# Patient Record
Sex: Female | Born: 1955 | Race: White | Hispanic: No | Marital: Married | Smoking: Former smoker
Health system: Southern US, Community
[De-identification: ages and names within clinical notes are randomized; demographics above are authoritative.]

## PROBLEM LIST (undated history)

## (undated) DIAGNOSIS — D369 Benign neoplasm, unspecified site: Secondary | ICD-10-CM

## (undated) DIAGNOSIS — B019 Varicella without complication: Secondary | ICD-10-CM

## (undated) DIAGNOSIS — M1990 Unspecified osteoarthritis, unspecified site: Secondary | ICD-10-CM

## (undated) DIAGNOSIS — I10 Essential (primary) hypertension: Secondary | ICD-10-CM

## (undated) DIAGNOSIS — N390 Urinary tract infection, site not specified: Secondary | ICD-10-CM

## (undated) DIAGNOSIS — E785 Hyperlipidemia, unspecified: Secondary | ICD-10-CM

## (undated) HISTORY — DX: Varicella without complication: B01.9

## (undated) HISTORY — PX: COLONOSCOPY: SHX174

## (undated) HISTORY — DX: Hyperlipidemia, unspecified: E78.5

## (undated) HISTORY — DX: Urinary tract infection, site not specified: N39.0

---

## 2015-08-24 ENCOUNTER — Emergency Department (HOSPITAL_COMMUNITY)
Admission: EM | Admit: 2015-08-24 | Discharge: 2015-08-24 | Disposition: A | Discharge status: Home / Self Care | Payer: 59 | Attending: Physician Assistant | Admitting: Physician Assistant

## 2015-08-24 ENCOUNTER — Encounter (HOSPITAL_COMMUNITY): Payer: Self-pay | Admitting: *Deleted

## 2015-08-24 ENCOUNTER — Emergency Department (HOSPITAL_COMMUNITY): Payer: 59

## 2015-08-24 DIAGNOSIS — Z87891 Personal history of nicotine dependence: Secondary | ICD-10-CM | POA: Diagnosis not present

## 2015-08-24 DIAGNOSIS — M79602 Pain in left arm: Secondary | ICD-10-CM | POA: Insufficient documentation

## 2015-08-24 DIAGNOSIS — R200 Anesthesia of skin: Secondary | ICD-10-CM | POA: Diagnosis not present

## 2015-08-24 DIAGNOSIS — I10 Essential (primary) hypertension: Secondary | ICD-10-CM | POA: Insufficient documentation

## 2015-08-24 HISTORY — DX: Essential (primary) hypertension: I10

## 2015-08-24 LAB — CBC
HCT: 40.7 % (ref 36.0–46.0)
HEMOGLOBIN: 14.1 g/dL (ref 12.0–15.0)
MCH: 31.4 pg (ref 26.0–34.0)
MCHC: 34.6 g/dL (ref 30.0–36.0)
MCV: 90.6 fL (ref 78.0–100.0)
PLATELETS: 260 10*3/uL (ref 150–400)
RBC: 4.49 MIL/uL (ref 3.87–5.11)
RDW: 12.8 % (ref 11.5–15.5)
WBC: 6.2 10*3/uL (ref 4.0–10.5)

## 2015-08-24 LAB — I-STAT TROPONIN, ED: TROPONIN I, POC: 0 ng/mL (ref 0.00–0.08)

## 2015-08-24 LAB — BASIC METABOLIC PANEL
ANION GAP: 9 (ref 5–15)
BUN: 17 mg/dL (ref 6–20)
CALCIUM: 9.5 mg/dL (ref 8.9–10.3)
CO2: 22 mmol/L (ref 22–32)
CREATININE: 0.97 mg/dL (ref 0.44–1.00)
Chloride: 109 mmol/L (ref 101–111)
GLUCOSE: 122 mg/dL — AB (ref 65–99)
Potassium: 3.9 mmol/L (ref 3.5–5.1)
SODIUM: 140 mmol/L (ref 135–145)

## 2015-08-24 MED ORDER — SODIUM CHLORIDE 0.9 % IV BOLUS (SEPSIS): 1000.0000 mL | Freq: Once | INTRAVENOUS | Status: DC

## 2015-08-24 NOTE — ED Notes (Signed)
Pt reports hx of HTN, but stopped taking medications years ago. Reports left arm numbness and pain x3 weeks, pt could not sleep last night due to pain so came to ED today. Denies trauma or injury. Denies SOB. Pain at present 3/10.

## 2015-08-24 NOTE — Discharge Instructions (Signed)
You have a lung nodule you need a follow up CT. Your blood pressure is out of control and you need to follow up with a physician.   Paresthesia Paresthesia is a burning or prickling feeling. This feeling can happen in any part of the body. It often happens in the hands, arms, legs, or feet. Usually, it is not painful. In most cases, the feeling goes away in a short time and is not a sign of a serious problem. HOME CARE  Avoid drinking alcohol.  Try massage or needle therapy (acupuncture) to help with your problems.  Keep all follow-up visits as told by your doctor. This is important. GET HELP IF:  You keep on having episodes of paresthesia.  Your burning or prickling feeling gets worse when you walk.  You have pain or cramps.  You feel dizzy.  You have a rash. GET HELP RIGHT AWAY IF:  You feel weak.  You have trouble walking or moving.  You have problems speaking, understanding, or seeing.  You feel confused.  You cannot control when you pee (urinate) or poop (bowel movement).  You lose feeling (numbness) after an injury.  You pass out (faint).   This information is not intended to replace advice given to you by your health care provider. Make sure you discuss any questions you have with your health care provider.   Document Released: 10/06/2008 Document Revised: 03/10/2015 Document Reviewed: 10/20/2014 Elsevier Interactive Patient Education 2016 [employer].    Emergency Department Resource Guide 1) Find a Doctor and Pay Out of Pocket Although you won't have to find out who is covered by your insurance plan, it is a good idea to ask around and get recommendations. You will then need to call the office and see if the doctor you have chosen will accept you as a new patient and what types of options they offer for patients who are self-pay. Some doctors offer discounts or will set up payment plans for their patients who do not have insurance, but you will need to ask so  you aren't surprised when you get to your appointment.  2) Contact Your Local Health Department Not all health departments have doctors that can see patients for sick visits, but many do, so it is worth a call to see if yours does. If you don't know where your local health department is, you can check in your phone book. The CDC also has a tool to help you locate your state's health department, and many state websites also have listings of all of their local health departments.  3) Find a [facility] If your illness is not likely to be very severe or complicated, you may want to try a walk in clinic. These are popping up all over the country in pharmacies, drugstores, and shopping centers. They're usually staffed by nurse practitioners or physician assistants that have been trained to treat common illnesses and complaints. They're usually fairly quick and inexpensive. However, if you have serious medical issues or chronic medical problems, these are probably not your best option.  No Primary Care Doctor: - Call Health Connect at  [phone] - they can help you locate a primary care doctor that  accepts your insurance, provides certain services, etc. - Physician Referral Service- [phone]  Chronic Pain Problems: Organization         Address  Phone   Notes  [facility]  [phone] Patients need to be referred by their primary care doctor.  Medication Assistance: Organization         Address  Phone   Notes  [facility] Medication [facility] [address] [place], [place] [address] [phone] --Must be a resident of [facility] -- Must have NO insurance coverage whatsoever (no Medicaid/ Medicare, etc.) -- The pt. MUST have a primary care doctor that directs their care regularly and follows them in the community   MedAssist  [phone]   [employer]  [phone]    Agencies that provide inexpensive medical  care: Organization         Address  Phone   Notes  [place]  [phone]   [facility]    [phone]   [facility] [place], [place] [address] [phone]   [place] [address] [place] [phone]   Planned Parenthood    [phone]   [facility]    [phone]   [place] and [place] [address], [place] Phone:  [phone], Fax:  [phone] Hours of Operation:  9 am - 6 pm, M-F.  Also accepts Medicaid/Medicare and self-pay.  [facility] for [address], [address], [place] Phone: [phone], Fax: [phone]. Hours of Operation:  8:30 am - 5:30 pm, M-F.  Also accepts Medicaid and self-pay.  [facility] [place] [address], [place] Phone: [phone]   [place], [place], [place] [phone], Ext. [phone] Mondays & Thursdays: 7-9 AM.  First 15 patients are seen on a first come, first serve basis.    [place] Providers:  Organization         Address  Phone   Notes  [facility] [address], [place] [phone] Also accepts self-pay patients.  [facility] [address], [place]  [phone]   [place], [address], [place] [phone]   [facility] [address], [place] [phone]   Lucianne Lei [address], [place]   [phone] Only accepts [place] Access [place] patients after they have their name applied to their card.   Self-Pay (no insurance) in [facility]:  Organization         Address  Phone   Notes  Sickle Cell Patients, [facility] [place] [phone]   [facility] [place] [phone]   [facility] [place]  [place], [place], [employer] [place] [phone]   [facility]/Dr. Osei-Bonsu  [address] [place] or [address], [place] [phone] Phone number for both [place] and [place] locations is the same.  [facility] [address] [place] [phone]   [facility] [address] [place] or [address] [phone]   [facility] [address], [place] [phone], phone; [phone], fax Sees patients 1st and 3rd Saturday of every month.  Must not qualify for public or private insurance (i.e. Medicaid, Medicare, [place] Health Choice, Veterans' Benefits)  Household income should be no more than 200% of the poverty level The  clinic cannot treat you if you are pregnant or think you are pregnant  Sexually transmitted diseases are not treated at the clinic.    Dental Care: Organization         Address  Phone  Notes  [facility] Department of [facility] [place] [phone] Accepts children up to age 46 who are enrolled in [place] or [place]; pregnant women with a Medicaid card; and children who have applied for Medicaid or [place] Health Choice, but were declined, whose parents can pay a reduced fee at time of service.  [facility] Department of [facility]  [address], [place] [phone] Accepts children up to age 21 who are enrolled in [place] or [place]; pregnant women with a Medicaid card; and children who have applied for Medicaid or [place] Health Choice, but were declined, whose parents can pay a reduced fee at time of service.  [place] Adult [facility] Access PRO[facility]  [place] [phone] Patients are seen by appointment only. Walk-ins are not accepted. [place] will see patients 8 years of age and older. Monday - Tuesday (8am-5pm) Most Wednesdays (8:30-5pm) $30 per visit, cash only  [facility] Adult Dental Access PROGRAM  [address], [facility] [phone] Patients are seen by appointment only. Walk-ins are not accepted. [place] will see patients 37 years of age and older. One Wednesday Evening (Monthly: Volunteer Based).  $30 per visit, cash only  [place]  [phone] for adults; Children under age 8, call Graduate Pediatric Dentistry at [phone]. Children aged 74-14, please call [phone] to request a pediatric application.  Dental services are provided in all areas of dental care including fillings, crowns and bridges, complete and partial dentures, implants, gum treatment, root canals, and extractions. Preventive care is also provided. Treatment is provided to both adults and children. Patients are selected via a lottery and there is often a waiting list.   [facility] [address] [place]  [phone] [email or web address]   [facility] [address] [place], [place] [phone], Ext. [phone] Second and Fourth Thursday of each month, opens at 6:30 AM; Clinic ends at 9 AM.  Patients are seen on a first-come first-served basis, and a limited number are seen during each clinic.   [facility]  [address] Hillard Danker [place], [place] [phone]   Eligibility Requirements You must have lived in [place], [place], or [place] counties for at least the last three months.   You cannot be eligible for state or federal sponsored [employer], including [employer], [place], or [employer].   You generally cannot be eligible for healthcare insurance through your employer.    How to apply: Eligibility screenings are held every Tuesday and Wednesday afternoon from 1:00 pm until 4:00 pm. You do not need an appointment for the interview!   [facility] [address] [place], [place]   [place]  [place] Department  [place]  [phone]    Behavioral Health Resources in the Community: Intensive Outpatient Programs Organization         Address  Phone  Notes  [place]. [address] [place], [place] [phone]   [facility] [address] [place],  [place] [phone]   ADS: [facility] & Drug S[facility] [address] [place], [place]   [place] [address],  [place], [place] or [phone]   Substance Abuse Resources Organization         Address  Phone  Notes  Alcohol and Drug Services  6311356418   [place]  [phone]   The [place]   Chinita Pester  [phone]   Residential & Outpatient Substance Abuse Program  [phone]   Psychological Services Organization         Address  Phone  Notes  [facility] [place]  [place]  [phone]   [place] [address], [place] or [phone]    Mobile Crisis Teams Organization         Address  Phone  Notes  [facility], Mobile Crisis Care Unit  [phone]   [facility]  [address] [place], [place]   [place] [address] [place] [phone]    Self-Help/Support Groups Organization         Address  Phone             Notes  [place]. of [place] - variety of support groups  [place] Call for more information  Narcotics Anonymous (NA), [facility] [address], [employer] [place]  2 meetings at this location   Special educational needs teacher         Address  Phone  Notes  [facility] [place],    [place]  [phone]   [facility]  [address], [place] [address], [place], [place]   [place], [place] [phone] Admissions: 8am-3pm M-F  Incentives Substance [place] [address],    [place], [place] [phone]   [facility] [address] [place], [place] [employer] [place], [place]   [facility] [address],  [place], [place]   [facility] - Intensive Outpatient [address], [place], [place]   [facility] [place].,  [place], [place] [phone] or [phone]   [facility] ([facility]) [address] [place], [place] Accepts Medicaid  [facility] [place] [address]  [place] [phone] Substance Abuse/Addiction Treatment   [facility] Organization         Address  Phone  Notes  [facility]  [phone]   Domenic Schwab, PhD [address] Arlis [place], [place]   [phone] or [phone]   [place], [place] [phone]   [facility] [address] [place], [place] [phone] Insurance/Medicaid/sponsorship through [facility] and Families [address], [address] [place]                                    [place], [place] [phone] [place] [address], [place] [phone]    Dr. Adele Schilder  [phone]   [facility] Dept. 1) [address], [place] 2) [place] 3)  [place] [address], [place] [phone]  (  [place] [phone] or [phone] (After Hours)

## 2015-08-24 NOTE — ED Provider Notes (Signed)
CSN: [id number]     Arrival date & time 08/24/15  0848 History   First MD Initiated Contact with Patient 08/24/15 [phone]     Chief Complaint  Patient presents with  . left arm numbness and pain      (Consider location/radiation/quality/duration/timing/severity/associated sxs/prior Treatment) HPI  Patient is very pleasant 59 year old female presenting with left arm numbness and occasional pain for the last 3 weeks on and off. Patient has insurance has insurance but has not seen a primary care physician bc she says because she doesn't "want to be stuck in that loop of doctors"  Patient has had on and off left whole arm numbness and pain. She is not having any numbness or pain currently. Patient's female partner states that she often sleeps with her head hanging off the side of the bed. Patient had no weight loss, weakness.   Past Medical History  Diagnosis Date  . Hypertension    History reviewed. No pertinent past surgical history. History reviewed. No pertinent family history. Social History  Substance Use Topics  . Smoking status: Former Smoker -- 1.00 packs/day for 40 years    Types: Cigarettes  . Smokeless tobacco: None  . Alcohol Use: Yes     Comment: daily   OB History    No data available     Review of Systems  Constitutional: Negative for fever, activity change and fatigue.  HENT: Negative for congestion.   Eyes: Negative for discharge.  Respiratory: Negative for cough, chest tightness and shortness of breath.   Cardiovascular: Negative for chest pain, palpitations and leg swelling.  Gastrointestinal: Negative for abdominal distention.  Genitourinary: Negative for dysuria and difficulty urinating.  Musculoskeletal: Negative for joint swelling.  Skin: Negative for rash.  Allergic/Immunologic: Negative for immunocompromised state.  Neurological: Positive for numbness. Negative for dizziness, seizures, speech difficulty, weakness, light-headedness and headaches.   Psychiatric/Behavioral: Negative for behavioral problems and confusion.      Allergies  Review of patient's allergies indicates no known allergies.  Home Medications   Prior to Admission medications   Medication Sig Start Date End Date Taking? Authorizing Provider  acetaminophen (TYLENOL) 500 MG tablet Take 500 mg by mouth every 4 (four) hours as needed for mild pain, moderate pain, fever or headache.   Yes Historical Provider, MD  ibuprofen (ADVIL,MOTRIN) 200 MG tablet Take 200 mg by mouth every 4 (four) hours as needed for fever, headache, mild pain, moderate pain or cramping.   Yes Historical Provider, MD   BP 185/108 mmHg  Pulse 122  Temp(Src) 97.9 F (36.6 C) (Oral)  Resp 16  SpO2 98% Physical Exam  Constitutional: She is oriented to person, place, and time. She appears well-developed and well-nourished.  HENT:  Head: Normocephalic and atraumatic.  Eyes: Conjunctivae are normal. Right eye exhibits no discharge.  Neck: Neck supple.  Cardiovascular: Normal rate, regular rhythm and normal heart sounds.   No murmur heard. Pulmonary/Chest: Effort normal and breath sounds normal. She has no wheezes. She has no rales.  Abdominal: Soft. She exhibits no distension. There is no tenderness.  Musculoskeletal: Normal range of motion. She exhibits no edema.  Normal strength and sensation in bilateral upper extremities.  Neurological: She is oriented to person, place, and time. No cranial nerve deficit.  Skin: Skin is warm and dry. No rash noted. She is not diaphoretic.  Psychiatric: She has a normal mood and affect. Her behavior is normal.  Nursing note and vitals reviewed.   ED Course  Procedures (including critical  care time) Labs Review Labs Reviewed  BASIC METABOLIC PANEL - Abnormal; Notable for the following:    Glucose, Bld 122 (*)    All other components within normal limits  CBC  I-STAT TROPOININ, ED    Imaging Review Dg Chest 2 View  08/24/2015  CLINICAL DATA:   Left upper extremity pain and numbness for 3 weeks. EXAM: CHEST  2 VIEW COMPARISON:  None. FINDINGS: Normal heart size. Normal mediastinal contour. No pneumothorax. No pleural effusion. There is a 4 mm nodular density in the peripheral upper right lung overlying the anterior right third rib. Otherwise clear lungs, with no pulmonary edema and no focal lung consolidation. IMPRESSION: Tiny 4 mm nodular density in the peripheral upper right lung overlying the anterior right third rib, differential includes pulmonary nodule or sclerotic anterior right third rib lesion. Otherwise no active disease in the chest. If no prior chest imaging is available to confirm stability of this finding, recommend follow-up short-term nonemergent unenhanced chest CT for further evaluation. Electronically Signed   By: Ilona Sorrel M.D.   On: 08/24/2015 09:39   I have personally reviewed and evaluated these images and lab results as part of my medical decision-making.   EKG Interpretation   Date/Time:  Monday August 24 2015 08:59:46 EDT Ventricular Rate:  111 PR Interval:  158 QRS Duration: 92 QT Interval:  325 QTC Calculation: 442 R Axis:   90 Text Interpretation:  Sinus tachycardia Borderline right axis deviation no  acute ischemia normal sinus rhtym.  Confirmed by Gerald Leitz ([id number])  on 08/24/2015 9:08:14 AM      MDM   Final diagnoses:  None    Patient is a very pleasant 59 year old female presenting with left arm on and off tingling and pain for the last 3 weeks. Patient denies a chest pain. Patient does have hypertension but has not been going to physicians and therefore has not been taking any medications for it.   We will get chest x-ray, EKG, and a single troponin which will be sufficient given the length of symptoms.  She reports that the numbness and pain is worse when she lies down at night.  Patient does not like physicians. However we talked to her and her husband about the necessity of  following up on her high blood pressure, and a pulmonary nodule seen on her chest x-ray. Patient also may require outpatient imaging of her  C-spine given her symptoms. However do not think it warrants emergent imaging given the lack of weakness or red flags-no fevers no bowel or bladder no weakness.    Dyneshia Baccam Julio Alm, MD 08/24/15 1011

## 2015-08-31 ENCOUNTER — Encounter: Payer: Self-pay | Admitting: Family

## 2015-08-31 ENCOUNTER — Ambulatory Visit (INDEPENDENT_AMBULATORY_CARE_PROVIDER_SITE_OTHER): Payer: 59 | Admitting: Family

## 2015-08-31 ENCOUNTER — Ambulatory Visit (INDEPENDENT_AMBULATORY_CARE_PROVIDER_SITE_OTHER)
Admission: RE | Admit: 2015-08-31 | Discharge: 2015-08-31 | Disposition: A | Discharge status: Home / Self Care | Payer: 59 | Source: Ambulatory Visit | Attending: Family | Admitting: Family

## 2015-08-31 VITALS — BP 160/102 | HR 112 | Temp 98.2°F | Resp 16 | Ht 66.0 in | Wt 160.0 lb

## 2015-08-31 DIAGNOSIS — R911 Solitary pulmonary nodule: Secondary | ICD-10-CM | POA: Diagnosis not present

## 2015-08-31 DIAGNOSIS — M79602 Pain in left arm: Secondary | ICD-10-CM | POA: Diagnosis not present

## 2015-08-31 MED ORDER — IBUPROFEN 800 MG PO TABS: 800.0000 mg | ORAL_TABLET | Freq: Three times a day (TID) | ORAL | Status: DC | PRN

## 2015-08-31 NOTE — Assessment & Plan Note (Signed)
Incidentally noted lung nodule located in the right lung. Asymptomatic at present. Recommended follow up with CT scan. Obtain CT chest to follow up on lung nodule.

## 2015-08-31 NOTE — Progress Notes (Signed)
Pre visit review using our clinic review tool, if applicable. No additional management support is needed unless otherwise documented below in the visit note.   Refused flu and tetanus at this time

## 2015-08-31 NOTE — Assessment & Plan Note (Signed)
Left arm pain of questionable origin possibly cervical. Obtain x-rays to rule out cervical pathology. Muscle spasm of upper trapezius noted. Treat conservatively at this time with ibuprofen, home exercise therapy, and heat/ice. Follow-up pending home exercise therapy and imaging.

## 2015-08-31 NOTE — Patient Instructions (Addendum)
Thank you for choosing [facility].  Summary/Instructions:  Your prescription(s) have been submitted to your pharmacy or been printed and provided for you. Please take as directed and contact our office if you believe you are having problem(s) with the medication(s) or have any questions.  Please stop by radiology on the basement level of the building for your x-rays. Your results will be released to Page (or called to you) after review, usually within 72 hours after test completion. If any treatments or changes are necessary, you will be notified at that same time.  Referrals have been made during this visit. You should expect to hear back from our schedulers in about 7-10 days in regards to establishing an appointment with the specialists we discussed.   If your symptoms worsen or fail to improve, please contact our office for further instruction, or in case of emergency go directly to the emergency room at the closest medical facility.   TREATMENT  Treatment initially involves the use of ice and medication to help reduce pain and inflammation. It is also important to perform strengthening and stretching exercises and modify activities that worsen symptoms so the injury does not get worse. These exercises may be performed at home or with a therapist. For patients who experience severe symptoms, a soft, padded collar may be recommended to be worn around the neck.  Improving your posture may help reduce symptoms. Posture improvement includes pulling your chin and abdomen in while sitting or standing. If you are sitting, sit in a firm chair with your buttocks against the back of the chair. While sleeping, try replacing your pillow with a small towel rolled to 2 inches in diameter, or use a cervical pillow or soft cervical collar. Poor sleeping positions delay healing.  For patients with nerve root damage, which causes numbness or weakness, the use of a cervical traction apparatus may be  recommended. Surgery is rarely necessary for these injuries. However, cervical strain and sprains that are present at birth (congenital) may require surgery. MEDICATION   If pain medication is necessary, nonsteroidal anti-inflammatory medications, such as aspirin and ibuprofen, or other minor pain relievers, such as acetaminophen, are often recommended.  Do not take pain medication for 7 days before surgery.  Prescription pain relievers may be given if deemed necessary by your caregiver. Use only as directed and only as much as you need. HEAT AND COLD:   Cold treatment (icing) relieves pain and reduces inflammation. Cold treatment should be applied for 10 to 15 minutes every 2 to 3 hours for inflammation and pain and immediately after any activity that aggravates your symptoms. Use ice packs or an ice massage.  Heat treatment may be used prior to performing the stretching and strengthening activities prescribed by your caregiver, physical therapist, or athletic trainer. Use a heat pack or a warm soak. SEEK MEDICAL CARE IF:   Symptoms get worse or do not improve in 2 weeks despite treatment.  New, unexplained symptoms develop (drugs used in treatment may produce side effects). EXERCISES RANGE OF MOTION (ROM) AND STRETCHING EXERCISES - Cervical Strain and Sprain These exercises may help you when beginning to rehabilitate your injury. In order to successfully resolve your symptoms, you must improve your posture. These exercises are designed to help reduce the forward-head and rounded-shoulder posture which contributes to this condition. Your symptoms may resolve with or without further involvement from your physician, physical therapist or athletic trainer. While completing these exercises, remember:   Restoring tissue flexibility helps normal  motion to return to the joints. This allows healthier, less painful movement and activity.  An effective stretch should be held for at least 20 seconds,  although you may need to begin with shorter hold times for comfort.  A stretch should never be painful. You should only feel a gentle lengthening or release in the stretched tissue. STRETCH- Axial Extensors  Lie on your back on the floor. You may bend your knees for comfort. Place a rolled-up hand towel or dish towel, about 2 inches in diameter, under the part of your head that makes contact with the floor.  Gently tuck your chin, as if trying to make a "double chin," until you feel a gentle stretch at the base of your head.  Hold __________ seconds. Repeat __________ times. Complete this exercise __________ times per day.  STRETCH - Axial Extension   Stand or sit on a firm surface. Assume a good posture: chest up, shoulders drawn back, abdominal muscles slightly tense, knees unlocked (if standing) and feet hip width apart.  Slowly retract your chin so your head slides back and your chin slightly lowers. Continue to look straight ahead.  You should feel a gentle stretch in the back of your head. Be certain not to feel an aggressive stretch since this can cause headaches later.  Hold for __________ seconds. Repeat __________ times. Complete this exercise __________ times per day. STRETCH - Cervical Side Bend   Stand or sit on a firm surface. Assume a good posture: chest up, shoulders drawn back, abdominal muscles slightly tense, knees unlocked (if standing) and feet hip width apart.  Without letting your nose or shoulders move, slowly tip your right / left ear to your shoulder until your feel a gentle stretch in the muscles on the opposite side of your neck.  Hold __________ seconds. Repeat __________ times. Complete this exercise __________ times per day. STRETCH - Cervical Rotators   Stand or sit on a firm surface. Assume a good posture: chest up, shoulders drawn back, abdominal muscles slightly tense, knees unlocked (if standing) and feet hip width apart.  Keeping your eyes level  with the ground, slowly turn your head until you feel a gentle stretch along the back and opposite side of your neck.  Hold __________ seconds. Repeat __________ times. Complete this exercise __________ times per day. RANGE OF MOTION - Neck Circles   Stand or sit on a firm surface. Assume a good posture: chest up, shoulders drawn back, abdominal muscles slightly tense, knees unlocked (if standing) and feet hip width apart.  Gently roll your head down and around from the back of one shoulder to the back of the other. The motion should never be forced or painful.  Repeat the motion 10-20 times, or until you feel the neck muscles relax and loosen. Repeat __________ times. Complete the exercise __________ times per day. STRENGTHENING EXERCISES - Cervical Strain and Sprain These exercises may help you when beginning to rehabilitate your injury. They may resolve your symptoms with or without further involvement from your physician, physical therapist, or athletic trainer. While completing these exercises, remember:   Muscles can gain both the endurance and the strength needed for everyday activities through controlled exercises.  Complete these exercises as instructed by your physician, physical therapist, or athletic trainer. Progress the resistance and repetitions only as guided.  You may experience muscle soreness or fatigue, but the pain or discomfort you are trying to eliminate should never worsen during these exercises. If this pain does  worsen, stop and make certain you are following the directions exactly. If the pain is still present after adjustments, discontinue the exercise until you can discuss the trouble with your clinician. STRENGTH - Cervical Flexors, Isometric  Face a wall, standing about 6 inches away. Place a small pillow, a ball about 6-8 inches in diameter, or a folded towel between your forehead and the wall.  Slightly tuck your chin and gently push your forehead into the  soft object. Push only with mild to moderate intensity, building up tension gradually. Keep your jaw and forehead relaxed.  Hold 10 to 20 seconds. Keep your breathing relaxed.  Release the tension slowly. Relax your neck muscles completely before you start the next repetition. Repeat __________ times. Complete this exercise __________ times per day. STRENGTH- Cervical Lateral Flexors, Isometric   Stand about 6 inches away from a wall. Place a small pillow, a ball about 6-8 inches in diameter, or a folded towel between the side of your head and the wall.  Slightly tuck your chin and gently tilt your head into the soft object. Push only with mild to moderate intensity, building up tension gradually. Keep your jaw and forehead relaxed.  Hold 10 to 20 seconds. Keep your breathing relaxed.  Release the tension slowly. Relax your neck muscles completely before you start the next repetition. Repeat __________ times. Complete this exercise __________ times per day. STRENGTH - Cervical Extensors, Isometric   Stand about 6 inches away from a wall. Place a small pillow, a ball about 6-8 inches in diameter, or a folded towel between the back of your head and the wall.  Slightly tuck your chin and gently tilt your head back into the soft object. Push only with mild to moderate intensity, building up tension gradually. Keep your jaw and forehead relaxed.  Hold 10 to 20 seconds. Keep your breathing relaxed.  Release the tension slowly. Relax your neck muscles completely before you start the next repetition. Repeat __________ times. Complete this exercise __________ times per day. POSTURE AND BODY MECHANICS CONSIDERATIONS - Cervical Strain and Sprain Keeping correct posture when sitting, standing or completing your activities will reduce the stress put on different body tissues, allowing injured tissues a chance to heal and limiting painful experiences. The following are general guidelines for improved  posture. Your physician or physical therapist will provide you with any instructions specific to your needs. While reading these guidelines, remember:  The exercises prescribed by your provider will help you have the flexibility and strength to maintain correct postures.  The correct posture provides the optimal environment for your joints to work. All of your joints have less wear and tear when properly supported by a spine with good posture. This means you will experience a healthier, less painful body.  Correct posture must be practiced with all of your activities, especially prolonged sitting and standing. Correct posture is as important when doing repetitive low-stress activities (typing) as it is when doing a single heavy-load activity (lifting). PROLONGED STANDING WHILE SLIGHTLY LEANING FORWARD When completing a task that requires you to lean forward while standing in one place for a long time, place either foot up on a stationary 2- to 4-inch high object to help maintain the best posture. When both feet are on the ground, the low back tends to lose its slight inward curve. If this curve flattens (or becomes too large), then the back and your other joints will experience too much stress, fatigue more quickly, and can cause pain.  RESTING POSITIONS Consider which positions are most painful for you when choosing a resting position. If you have pain with flexion-based activities (sitting, bending, stooping, squatting), choose a position that allows you to rest in a less flexed posture. You would want to avoid curling into a fetal position on your side. If your pain worsens with extension-based activities (prolonged standing, working overhead), avoid resting in an extended position such as sleeping on your stomach. Most people will find more comfort when they rest with their spine in a more neutral position, neither too rounded nor too arched. Lying on a non-sagging bed on your side with a pillow  between your knees, or on your back with a pillow under your knees will often provide some relief. Keep in mind, being in any one position for a prolonged period of time, no matter how correct your posture, can still lead to stiffness. WALKING Walk with an upright posture. Your ears, shoulders, and hips should all line up. OFFICE WORK When working at a desk, create an environment that supports good, upright posture. Without extra support, muscles fatigue and lead to excessive strain on joints and other tissues. CHAIR:  A chair should be able to slide under your desk when your back makes contact with the back of the chair. This allows you to work closely.  The chair's height should allow your eyes to be level with the upper part of your monitor and your hands to be slightly lower than your elbows.  Body position:  Your feet should make contact with the floor. If this is not possible, use a foot rest.  Keep your ears over your shoulders. This will reduce stress on your neck and low back.   This information is not intended to replace advice given to you by your health care provider. Make sure you discuss any questions you have with your health care provider.   Document Released: 10/24/2005 Document Revised: 11/14/2014 Document Reviewed: 02/05/2009 [employer] Interactive Patient Education 2016 [employer]  Impingement Syndrome, Rotator Cuff, Bursitis With Rehab Impingement syndrome is a condition that involves inflammation of the tendons of the rotator cuff and the subacromial bursa, that causes pain in the shoulder. The rotator cuff consists of four tendons and muscles that control much of the shoulder and upper arm function. The subacromial bursa is a fluid filled sac that helps reduce friction between the rotator cuff and one of the bones of the shoulder (acromion). Impingement syndrome is usually an overuse injury that causes swelling of the bursa (bursitis), swelling of the tendon  (tendonitis), and/or a tear of the tendon (strain). Strains are classified into three categories. Grade 1 strains cause pain, but the tendon is not lengthened. Grade 2 strains include a lengthened ligament, due to the ligament being stretched or partially ruptured. With grade 2 strains there is still function, although the function may be decreased. Grade 3 strains include a complete tear of the tendon or muscle, and function is usually impaired. SYMPTOMS   Pain around the shoulder, often at the outer portion of the upper arm.  Pain that gets worse with shoulder function, especially when reaching overhead or lifting.  Sometimes, aching when not using the arm.  Pain that wakes you up at night.  Sometimes, tenderness, swelling, warmth, or redness over the affected area.  Loss of strength.  Limited motion of the shoulder, especially reaching behind the back (to the back pocket or to unhook bra) or across your body.  Crackling sound (crepitation) when moving  the arm.  Biceps tendon pain and inflammation (in the front of the shoulder). Worse when bending the elbow or lifting. CAUSES  Impingement syndrome is often an overuse injury, in which chronic (repetitive) motions cause the tendons or bursa to become inflamed. A strain occurs when a force is paced on the tendon or muscle that is greater than it can withstand. Common mechanisms of injury include: Stress from sudden increase in duration, frequency, or intensity of training.  Direct hit (trauma) to the shoulder.  Aging, erosion of the tendon with normal use.  Bony bump on shoulder (acromial spur). RISK INCREASES WITH:  Contact sports (football, wrestling, boxing).  Throwing sports (baseball, tennis, volleyball).  Weightlifting and bodybuilding.  Heavy labor.  Previous injury to the rotator cuff, including impingement.  Poor shoulder strength and flexibility.  Failure to warm up properly before activity.  Inadequate  protective equipment.  Old age.  Bony bump on shoulder (acromial spur). PREVENTION   Warm up and stretch properly before activity.  Allow for adequate recovery between workouts.  Maintain physical fitness:  Strength, flexibility, and endurance.  Cardiovascular fitness.  Learn and use proper exercise technique. PROGNOSIS  If treated properly, impingement syndrome usually goes away within 6 weeks. Sometimes surgery is required.  RELATED COMPLICATIONS   Longer healing time if not properly treated, or if not given enough time to heal.  Recurring symptoms, that result in a chronic condition.  Shoulder stiffness, frozen shoulder, or loss of motion.  Rotator cuff tendon tear.  Recurring symptoms, especially if activity is resumed too soon, with overuse, with a direct blow, or when using poor technique. TREATMENT  Treatment first involves the use of ice and medicine, to reduce pain and inflammation. The use of strengthening and stretching exercises may help reduce pain with activity. These exercises may be performed at home or with a therapist. If non-surgical treatment is unsuccessful after more than 6 months, surgery may be advised. After surgery and rehabilitation, activity is usually possible in 3 months.  MEDICATION  If pain medicine is needed, nonsteroidal anti-inflammatory medicines (aspirin and ibuprofen), or other minor pain relievers (acetaminophen), are often advised.  Do not take pain medicine for 7 days before surgery.  Prescription pain relievers may be given, if your caregiver thinks they are needed. Use only as directed and only as much as you need.  Corticosteroid injections may be given by your caregiver. These injections should be reserved for the most serious cases, because they may only be given a certain number of times. HEAT AND COLD  Cold treatment (icing) should be applied for 10 to 15 minutes every 2 to 3 hours for inflammation and pain, and immediately  after activity that aggravates your symptoms. Use ice packs or an ice massage.  Heat treatment may be used before performing stretching and strengthening activities prescribed by your caregiver, physical therapist, or athletic trainer. Use a heat pack or a warm water soak. SEEK MEDICAL CARE IF:   Symptoms get worse or do not improve in 4 to 6 weeks, despite treatment.  New, unexplained symptoms develop. (Drugs used in treatment may produce side effects.) EXERCISES  RANGE OF MOTION (ROM) AND STRETCHING EXERCISES - Impingement Syndrome (Rotator Cuff  Tendinitis, Bursitis) These exercises may help you when beginning to rehabilitate your injury. Your symptoms may go away with or without further involvement from your physician, physical therapist or athletic trainer. While completing these exercises, remember:   Restoring tissue flexibility helps normal motion to return to the joints.  This allows healthier, less painful movement and activity.  An effective stretch should be held for at least 30 seconds.  A stretch should never be painful. You should only feel a gentle lengthening or release in the stretched tissue. STRETCH - Flexion, Standing  Stand with good posture. With an underhand grip on your right / left hand, and an overhand grip on the opposite hand, grasp a broomstick or cane so that your hands are a little more than shoulder width apart.  Keeping your right / left elbow straight and shoulder muscles relaxed, push the stick with your opposite hand, to raise your right / left arm in front of your body and then overhead. Raise your arm until you feel a stretch in your right / left shoulder, but before you have increased shoulder pain.  Try to avoid shrugging your right / left shoulder as your arm rises, by keeping your shoulder blade tucked down and toward your mid-back spine. Hold for __________ seconds.  Slowly return to the starting position. Repeat __________ times. Complete this  exercise __________ times per day. STRETCH - Abduction, Supine  Lie on your back. With an underhand grip on your right / left hand and an overhand grip on the opposite hand, grasp a broomstick or cane so that your hands are a little more than shoulder width apart.  Keeping your right / left elbow straight and your shoulder muscles relaxed, push the stick with your opposite hand, to raise your right / left arm out to the side of your body and then overhead. Raise your arm until you feel a stretch in your right / left shoulder, but before you have increased shoulder pain.  Try to avoid shrugging your right / left shoulder as your arm rises, by keeping your shoulder blade tucked down and toward your mid-back spine. Hold for __________ seconds.  Slowly return to the starting position. Repeat __________ times. Complete this exercise __________ times per day. ROM - Flexion, Active-Assisted  Lie on your back. You may bend your knees for comfort.  Grasp a broomstick or cane so your hands are about shoulder width apart. Your right / left hand should grip the end of the stick, so that your hand is positioned "thumbs-up," as if you were about to shake hands.  Using your healthy arm to lead, raise your right / left arm overhead, until you feel a gentle stretch in your shoulder. Hold for __________ seconds.  Use the stick to assist in returning your right / left arm to its starting position. Repeat __________ times. Complete this exercise __________ times per day.  ROM - Internal Rotation, Supine   Lie on your back on a firm surface. Place your right / left elbow about 60 degrees away from your side. Elevate your elbow with a folded towel, so that the elbow and shoulder are the same height.  Using a broomstick or cane and your strong arm, pull your right / left hand toward your body until you feel a gentle stretch, but no increase in your shoulder pain. Keep your shoulder and elbow in place throughout  the exercise.  Hold for __________ seconds. Slowly return to the starting position. Repeat __________ times. Complete this exercise __________ times per day. STRETCH - Internal Rotation  Place your right / left hand behind your back, palm up.  Throw a towel or belt over your opposite shoulder. Grasp the towel with your right / left hand.  While keeping an upright posture, gently pull  up on the towel, until you feel a stretch in the front of your right / left shoulder.  Avoid shrugging your right / left shoulder as your arm rises, by keeping your shoulder blade tucked down and toward your mid-back spine.  Hold for __________ seconds. Release the stretch, by lowering your healthy hand. Repeat __________ times. Complete this exercise __________ times per day. ROM - Internal Rotation   Using an underhand grip, grasp a stick behind your back with both hands.  While standing upright with good posture, slide the stick up your back until you feel a mild stretch in the front of your shoulder.  Hold for __________ seconds. Slowly return to your starting position. Repeat __________ times. Complete this exercise __________ times per day.  STRETCH - Posterior Shoulder Capsule   Stand or sit with good posture. Grasp your right / left elbow and draw it across your chest, keeping it at the same height as your shoulder.  Pull your elbow, so your upper arm comes in closer to your chest. Pull until you feel a gentle stretch in the back of your shoulder.  Hold for __________ seconds. Repeat __________ times. Complete this exercise __________ times per day. STRENGTHENING EXERCISES - Impingement Syndrome (Rotator Cuff Tendinitis, Bursitis) These exercises may help you when beginning to rehabilitate your injury. They may resolve your symptoms with or without further involvement from your physician, physical therapist or athletic trainer. While completing these exercises, remember:  Muscles can gain both  the endurance and the strength needed for everyday activities through controlled exercises.  Complete these exercises as instructed by your physician, physical therapist or athletic trainer. Increase the resistance and repetitions only as guided.  You may experience muscle soreness or fatigue, but the pain or discomfort you are trying to eliminate should never worsen during these exercises. If this pain does get worse, stop and make sure you are following the directions exactly. If the pain is still present after adjustments, discontinue the exercise until you can discuss the trouble with your clinician.  During your recovery, avoid activity or exercises which involve actions that place your injured hand or elbow above your head or behind your back or head. These positions stress the tissues which you are trying to heal. STRENGTH - Scapular Depression and Adduction   With good posture, sit on a firm chair. Support your arms in front of you, with pillows, arm rests, or on a table top. Have your elbows in line with the sides of your body.  Gently draw your shoulder blades down and toward your mid-back spine. Gradually increase the tension, without tensing the muscles along the top of your shoulders and the back of your neck.  Hold for __________ seconds. Slowly release the tension and relax your muscles completely before starting the next repetition.  After you have practiced this exercise, remove the arm support and complete the exercise in standing as well as sitting position. Repeat __________ times. Complete this exercise __________ times per day.  STRENGTH - Shoulder Abductors, Isometric  With good posture, stand or sit about 4-6 inches from a wall, with your right / left side facing the wall.  Bend your right / left elbow. Gently press your right / left elbow into the wall. Increase the pressure gradually, until you are pressing as hard as you can, without shrugging your shoulder or  increasing any shoulder discomfort.  Hold for __________ seconds.  Release the tension slowly. Relax your shoulder muscles completely before you begin  the next repetition. Repeat __________ times. Complete this exercise __________ times per day.  STRENGTH - External Rotators, Isometric  Keep your right / left elbow at your side and bend it 90 degrees.  Step into a door frame so that the outside of your right / left wrist can press against the door frame without your upper arm leaving your side.  Gently press your right / left wrist into the door frame, as if you were trying to swing the back of your hand away from your stomach. Gradually increase the tension, until you are pressing as hard as you can, without shrugging your shoulder or increasing any shoulder discomfort.  Hold for __________ seconds.  Release the tension slowly. Relax your shoulder muscles completely before you begin the next repetition. Repeat __________ times. Complete this exercise __________ times per day.  STRENGTH - Supraspinatus   Stand or sit with good posture. Grasp a __________ weight, or an exercise band or tubing, so that your hand is "thumbs-up," like you are shaking hands.  Slowly lift your right / left arm in a "V" away from your thigh, diagonally into the space between your side and straight ahead. Lift your hand to shoulder height or as far as you can, without increasing any shoulder pain. At first, many people do not lift their hands above shoulder height.  Avoid shrugging your right / left shoulder as your arm rises, by keeping your shoulder blade tucked down and toward your mid-back spine.  Hold for __________ seconds. Control the descent of your hand, as you slowly return to your starting position. Repeat __________ times. Complete this exercise __________ times per day.  STRENGTH - External Rotators  Secure a rubber exercise band or tubing to a fixed object (table, pole) so that it is at the same  height as your right / left elbow when you are standing or sitting on a firm surface.  Stand or sit so that the secured exercise band is at your uninjured side.  Bend your right / left elbow 90 degrees. Place a folded towel or small pillow under your right / left arm, so that your elbow is a few inches away from your side.  Keeping the tension on the exercise band, pull it away from your body, as if pivoting on your elbow. Be sure to keep your body steady, so that the movement is coming only from your rotating shoulder.  Hold for __________ seconds. Release the tension in a controlled manner, as you return to the starting position. Repeat __________ times. Complete this exercise __________ times per day.  STRENGTH - Internal Rotators   Secure a rubber exercise band or tubing to a fixed object (table, pole) so that it is at the same height as your right / left elbow when you are standing or sitting on a firm surface.  Stand or sit so that the secured exercise band is at your right / left side.  Bend your elbow 90 degrees. Place a folded towel or small pillow under your right / left arm so that your elbow is a few inches away from your side.  Keeping the tension on the exercise band, pull it across your body, toward your stomach. Be sure to keep your body steady, so that the movement is coming only from your rotating shoulder.  Hold for __________ seconds. Release the tension in a controlled manner, as you return to the starting position. Repeat __________ times. Complete this exercise __________ times per day.  STRENGTH - Scapular Protractors, Standing   Stand arms length away from a wall. Place your hands on the wall, keeping your elbows straight.  Begin by dropping your shoulder blades down and toward your mid-back spine.  To strengthen your protractors, keep your shoulder blades down, but slide them forward on your rib cage. It will feel as if you are lifting the back of your rib cage  away from the wall. This is a subtle motion and can be challenging to complete. Ask your caregiver for further instruction, if you are not sure you are doing the exercise correctly.  Hold for __________ seconds. Slowly return to the starting position, resting the muscles completely before starting the next repetition. Repeat __________ times. Complete this exercise __________ times per day. STRENGTH - Scapular Protractors, Supine  Lie on your back on a firm surface. Extend your right / left arm straight into the air while holding a __________ weight in your hand.  Keeping your head and back in place, lift your shoulder off the floor.  Hold for __________ seconds. Slowly return to the starting position, and allow your muscles to relax completely before starting the next repetition. Repeat __________ times. Complete this exercise __________ times per day. STRENGTH - Scapular Protractors, Quadruped  Get onto your hands and knees, with your shoulders directly over your hands (or as close as you can be, comfortably).  Keeping your elbows locked, lift the back of your rib cage up into your shoulder blades, so your mid-back rounds out. Keep your neck muscles relaxed.  Hold this position for __________ seconds. Slowly return to the starting position and allow your muscles to relax completely before starting the next repetition. Repeat __________ times. Complete this exercise __________ times per day.  STRENGTH - Scapular Retractors  Secure a rubber exercise band or tubing to a fixed object (table, pole), so that it is at the height of your shoulders when you are either standing, or sitting on a firm armless chair.  With a palm down grip, grasp an end of the band in each hand. Straighten your elbows and lift your hands straight in front of you, at shoulder height. Step back, away from the secured end of the band, until it becomes tense.  Squeezing your shoulder blades together, draw your elbows back  toward your sides, as you bend them. Keep your upper arms lifted away from your body throughout the exercise.  Hold for __________ seconds. Slowly ease the tension on the band, as you reverse the directions and return to the starting position. Repeat __________ times. Complete this exercise __________ times per day. STRENGTH - Shoulder Extensors   Secure a rubber exercise band or tubing to a fixed object (table, pole) so that it is at the height of your shoulders when you are either standing, or sitting on a firm armless chair.  With a thumbs-up grip, grasp an end of the band in each hand. Straighten your elbows and lift your hands straight in front of you, at shoulder height. Step back, away from the secured end of the band, until it becomes tense.  Squeezing your shoulder blades together, pull your hands down to the sides of your thighs. Do not allow your hands to go behind you.  Hold for __________ seconds. Slowly ease the tension on the band, as you reverse the directions and return to the starting position. Repeat __________ times. Complete this exercise __________ times per day.  STRENGTH - Scapular Retractors and External Rotators  Secure a rubber exercise band or tubing to a fixed object (table, pole) so that it is at the height as your shoulders, when you are either standing, or sitting on a firm armless chair.  With a palm down grip, grasp an end of the band in each hand. Bend your elbows 90 degrees and lift your elbows to shoulder height, at your sides. Step back, away from the secured end of the band, until it becomes tense.  Squeezing your shoulder blades together, rotate your shoulders so that your upper arms and elbows remain stationary, but your fists travel upward to head height.  Hold for __________ seconds. Slowly ease the tension on the band, as you reverse the directions and return to the starting position. Repeat __________ times. Complete this exercise __________ times  per day.  STRENGTH - Scapular Retractors and External Rotators, Rowing   Secure a rubber exercise band or tubing to a fixed object (table, pole) so that it is at the height of your shoulders, when you are either standing, or sitting on a firm armless chair.  With a palm down grip, grasp an end of the band in each hand. Straighten your elbows and lift your hands straight in front of you, at shoulder height. Step back, away from the secured end of the band, until it becomes tense.  Step 1: Squeeze your shoulder blades together. Bending your elbows, draw your hands to your chest, as if you are rowing a boat. At the end of this motion, your hands and elbow should be at shoulder height and your elbows should be out to your sides.  Step 2: Rotate your shoulders, to raise your hands above your head. Your forearms should be vertical and your upper arms should be horizontal.  Hold for __________ seconds. Slowly ease the tension on the band, as you reverse the directions and return to the starting position. Repeat __________ times. Complete this exercise __________ times per day.  STRENGTH - Scapular Depressors  Find a sturdy chair without wheels, such as a dining room chair.  Keeping your feet on the floor, and your hands on the chair arms, lift your bottom up from the seat, and lock your elbows.  Keeping your elbows straight, allow gravity to pull your body weight down. Your shoulders will rise toward your ears.  Raise your body against gravity by drawing your shoulder blades down your back, shortening the distance between your shoulders and ears. Although your feet should always maintain contact with the floor, your feet should progressively support less body weight, as you get stronger.  Hold for __________ seconds. In a controlled and slow manner, lower your body weight to begin the next repetition. Repeat __________ times. Complete this exercise __________ times per day.    This information is  not intended to replace advice given to you by your health care provider. Make sure you discuss any questions you have with your health care provider.   Document Released: 10/24/2005 Document Revised: 11/14/2014 Document Reviewed: 02/05/2009 Elsevier Interactive Patient Education [employer].

## 2015-08-31 NOTE — Progress Notes (Signed)
Subjective:    Patient ID: Melanie Douglas, female    DOB: 07/22/1956, 59 y.o.   MRN: [id number]  Chief Complaint  Patient presents with  . Establish Care    Left are numbess and pain for x4 weeks, it happens when she is sitting when going to bed and up moving around it doesn't bother her    HPI:  Melanie Douglas is a 59 y.o. female who  has a past medical history of Hypertension; Chicken pox; Hyperlipidemia; and UTI (lower urinary tract infection). and presents today for an office visit to establish care.   1.) Left arm numbness -  Associated symptom of pain and numbness located in her left arm ranging from her fingertips to her shoulder has been going on for about 4 weeks. Described as constantly aching and the numbness comes and goes. Modifying factors include moving around and is aggrevated by sitting for long periods of time and lying on it at night. Denies any trauma to the area or sounds/sensations that were heard or felt. Course of the pain has worsened slightly but is not changing. Denies any changes to ADLs. Describes some neck pain/discomfort. Tylenol and ibuprofen has helped with the pain. She is right hand dominant.   2.) Lung nodule - Recent seen in the emergency room for left arm numbness and was noted to have a 34mm nodular density in the peripheral upper right lung over the 3rd rib. It was recommended that she follow up with a CT scan for further evaluation.    No Known Allergies   Outpatient Prescriptions Prior to Visit  Medication Sig Dispense Refill  . acetaminophen (TYLENOL) 500 MG tablet Take 500 mg by mouth every 4 (four) hours as needed for mild pain, moderate pain, fever or headache.    . ibuprofen (ADVIL,MOTRIN) 200 MG tablet Take 200 mg by mouth every 4 (four) hours as needed for fever, headache, mild pain, moderate pain or cramping.     No facility-administered medications prior to visit.     Past Medical History  Diagnosis Date  . Hypertension   . Chicken pox     . Hyperlipidemia   . UTI (lower urinary tract infection)      History reviewed. No pertinent past surgical history.   Family History  Problem Relation Age of Onset  . Hyperlipidemia Mother   . Hypertension Mother   . Healthy Father   . Healthy Maternal Grandmother   . Prostate cancer Maternal Grandfather   . Healthy Paternal Grandmother   . Healthy Paternal Grandfather      Social History   Social History  . Marital Status: Married    Spouse Name: N/A  . Number of Children: 3  . Years of Education: 16   Occupational History  . Controller    Social History Main Topics  . Smoking status: Former Smoker -- 1.00 packs/day for 40 years    Types: Cigarettes  . Smokeless tobacco: Never Used  . Alcohol Use: 8.4 oz/week    14 Glasses of wine per week     Comment: daily  . Drug Use: No  . Sexual Activity: Not on file   Other Topics Concern  . Not on file   Social History Narrative   Fun: Garden   Denies religious beliefs effecting healthcare.   Denies abuse and feels safe at home.      Review of Systems  Constitutional: Negative for fever, chills, diaphoresis and unexpected weight change.  Respiratory: Negative for cough,  chest tightness, shortness of breath and wheezing.   Musculoskeletal: Positive for neck pain. Negative for neck stiffness.       Positive for left arm pain.  Neurological: Positive for numbness. Negative for weakness.      Objective:    BP 160/102 mmHg  Pulse 112  Temp(Src) 98.2 F (36.8 C) (Oral)  Resp 16  Ht 5\' 6"  (1.676 m)  Wt 160 lb (72.576 kg)  BMI 25.84 kg/m2  SpO2 98% Nursing note and vital signs reviewed.  Physical Exam  Constitutional: She is oriented to person, place, and time. She appears well-developed and well-nourished. No distress.  Cardiovascular: Normal rate, regular rhythm, normal heart sounds and intact distal pulses.   Pulmonary/Chest: Effort normal and breath sounds normal.  Musculoskeletal:  Left upper  extremity - no obvious deformity, discoloration, or edema noted. Tenderness elicited over upper trapezius, subacromial space, and sporadic areas across the lower upper extremity. Wrist flexion is normal. Wrist extension results in discomfort in her wrist. Range of motion of the elbow is normal with normal strength. Shoulder range of motion actively and passively is intact and appropriate. Negative apprehension, questionable positive impingement test. Distal pulses are intact and appropriate.  Neurological: She is alert and oriented to person, place, and time.  Skin: Skin is warm and dry.  Psychiatric: She has a normal mood and affect. Her behavior is normal. Judgment and thought content normal.       Assessment & Plan:   Problem List Items Addressed This Visit      Other   Left arm pain - Primary    Left arm pain of questionable origin possibly cervical. Obtain x-rays to rule out cervical pathology. Muscle spasm of upper trapezius noted. Treat conservatively at this time with ibuprofen, home exercise therapy, and heat/ice. Follow-up pending home exercise therapy and imaging.      Relevant Medications   ibuprofen (ADVIL,MOTRIN) 800 MG tablet   Other Relevant Orders   DG Cervical Spine Complete   Lung nodule    Incidentally noted lung nodule located in the right lung. Asymptomatic at present. Recommended follow up with CT scan. Obtain CT chest to follow up on lung nodule.       Relevant Orders   CT Chest Wo Contrast

## 2015-09-07 ENCOUNTER — Encounter: Payer: Self-pay | Admitting: Family

## 2015-09-07 ENCOUNTER — Ambulatory Visit (INDEPENDENT_AMBULATORY_CARE_PROVIDER_SITE_OTHER)
Admission: RE | Admit: 2015-09-07 | Discharge: 2015-09-07 | Disposition: A | Discharge status: Home / Self Care | Payer: 59 | Source: Ambulatory Visit | Attending: Family | Admitting: Family

## 2015-09-07 DIAGNOSIS — R911 Solitary pulmonary nodule: Secondary | ICD-10-CM

## 2015-09-18 ENCOUNTER — Ambulatory Visit (INDEPENDENT_AMBULATORY_CARE_PROVIDER_SITE_OTHER): Payer: 59 | Admitting: Family

## 2015-09-18 ENCOUNTER — Encounter: Payer: Self-pay | Admitting: Family

## 2015-09-18 VITALS — BP 158/104 | HR 95 | Temp 97.9°F | Resp 18 | Ht 66.0 in | Wt 161.0 lb

## 2015-09-18 DIAGNOSIS — I10 Essential (primary) hypertension: Secondary | ICD-10-CM | POA: Insufficient documentation

## 2015-09-18 DIAGNOSIS — M79602 Pain in left arm: Secondary | ICD-10-CM | POA: Diagnosis not present

## 2015-09-18 MED ORDER — PREDNISONE 10 MG (21) PO TBPK: ORAL_TABLET | ORAL | Status: DC

## 2015-09-18 MED ORDER — NEBIVOLOL HCL 5 MG PO TABS: 5.0000 mg | ORAL_TABLET | Freq: Every day | ORAL | Status: DC

## 2015-09-18 NOTE — Progress Notes (Signed)
Subjective:    Patient ID: Melanie Douglas, female    DOB: [birth year], 59 y.o.   MRN: [id number]  Chief Complaint  Patient presents with  . Follow-up    states that left arm barely has any pain anymore but it still goes numb    HPI:  [name] is a 59 y.o. female who  has a past medical history of Hypertension; Chicken pox; Hyperlipidemia; and UTI (lower urinary tract infection). and presents today for an office follow up    1.) Arm pain - previously seen in the office and diagnosed with left arm pain possibly related to cervical origin. 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 She was started on home exercise therapy and ibuprofen. Returns today with decreased pain and continues to experience associated symptom of numbness daily. The numbness is located from her neck and shoulder region through her hand. Primarily focused in the areas of C5-C6 and C6-C7. Denies weakness. Would like additional information as to the next steps.  No Known Allergies   Current Outpatient Prescriptions on File Prior to Visit  Medication Sig Dispense Refill  . ibuprofen (ADVIL,MOTRIN) 800 MG tablet Take 1 tablet (800 mg total) by mouth every 8 (eight) hours as needed. 90 tablet 0   No current facility-administered medications on file prior to visit.    Review of Systems  Constitutional: Negative for fever and chills.  Musculoskeletal: Negative for back pain, neck pain and neck stiffness.  Neurological: Positive for numbness. Negative for weakness.      Objective:    BP 158/104 mmHg  Pulse 95  Temp(Src) 97.9 F (36.6 C) (Oral)  Resp 18  Ht 5\' 6"  (1.676 m)  Wt 161 lb (73.029 kg)  BMI 26.00 kg/m2  SpO2 95% Nursing note and vital signs reviewed.  BP Readings from Last 3 Encounters:  09/18/15 158/104  08/31/15 160/102  08/24/15 185/108    Physical Exam  Constitutional: She is oriented to person, place, and time. She  appears well-developed and well-nourished. No distress.  Cardiovascular: Normal rate, regular rhythm, normal heart sounds and intact distal pulses.   Pulmonary/Chest: Effort normal and breath sounds normal.  Musculoskeletal:  Left upper extremity - no obvious deformity, discoloration, or edema noted. No tenderness able to be elicited. Wrist flexion is normal. Wrist extension results in discomfort in her wrist. Range of motion of the elbow is normal with normal strength. Shoulder range of motion actively and passively is intact and appropriate. Negative apprehension, questionable positive impingement test. Distal pulses are intact and appropriate.   Neurological: She is alert and oriented to person, place, and time.  Skin: Skin is warm and dry.  Psychiatric: She has a normal mood and affect. Her behavior is normal. Judgment and thought content normal.       Assessment & Plan:   Problem List Items Addressed This Visit      Cardiovascular and Mediastinum   Essential hypertension    Multiple readings of increased blood pressure consistent with hypertension. Start Bystolic. Monitor blood pressure at home. Follow-up pending medication trial with blood pressure readings.      Relevant Medications   nebivolol (BYSTOLIC) 5 MG tablet     Other   Left arm pain - Primary    Improved with ibuprofen and home exercise therapy. Exam consistent with x-ray results from C5-C6. Start prednisone taper. Continue home exercise therapy. Follow up if symptoms worsen or fail to improve.  Relevant Medications   predniSONE (STERAPRED UNI-PAK 21 TAB) 10 MG (21) TBPK tablet

## 2015-09-18 NOTE — Progress Notes (Signed)
Pre visit review using our clinic review tool, if applicable. No additional management support is needed unless otherwise documented below in the visit note. 

## 2015-09-18 NOTE — Patient Instructions (Addendum)
Thank you for choosing [facility].  Summary/Instructions:  Your prescription(s) have been submitted to your pharmacy or been printed and provided for you. Please take as directed and contact our office if you believe you are having problem(s) with the medication(s) or have any questions.  6 Day Prednisone Taper Instructions:   Day 1: Two tablets before breakfast, one after lunch, one after dinner, and two at bedtime.  Day 2: One tablet before breakfast, one after lunch, one after dinner, and two at bedtime Day 3: One tablet before breakfast, one after lunch, one after dinner, and one at bedtime Day 4: One tablet before breakfast, one after lunch, and one at bedtime Day 5: One tablet before breakfast and one at bedtime Day 6: One tablet before breakfast If your symptoms worsen or fail to improve, please contact our office for further instruction, or in case of emergency go directly to the emergency room at the closest medical facility.

## 2015-09-18 NOTE — Assessment & Plan Note (Signed)
Multiple readings of increased blood pressure consistent with hypertension. Start Bystolic. Monitor blood pressure at home. Follow-up pending medication trial with blood pressure readings.

## 2015-09-18 NOTE — Assessment & Plan Note (Signed)
Improved with ibuprofen and home exercise therapy. Exam consistent with x-ray results from C5-C6. Start prednisone taper. Continue home exercise therapy. Follow up if symptoms worsen or fail to improve.

## 2015-09-30 ENCOUNTER — Other Ambulatory Visit: Payer: Self-pay | Admitting: Family

## 2015-09-30 DIAGNOSIS — I10 Essential (primary) hypertension: Secondary | ICD-10-CM

## 2015-10-07 MED ORDER — NEBIVOLOL HCL 5 MG PO TABS: 5.0000 mg | ORAL_TABLET | Freq: Every day | ORAL | Status: DC

## 2015-10-07 NOTE — Addendum Note (Signed)
Addended by: Mauricio Po D on: 10/07/2015 09:57 PM   Modules accepted: Orders

## 2015-10-09 MED ORDER — NEBIVOLOL HCL 5 MG PO TABS: 5.0000 mg | ORAL_TABLET | Freq: Every day | ORAL | Status: DC

## 2015-10-09 NOTE — Addendum Note (Signed)
Addended by: Mauricio Po D on: 10/09/2015 05:53 PM   Modules accepted: Orders

## 2015-11-26 ENCOUNTER — Other Ambulatory Visit: Payer: Self-pay | Admitting: Family

## 2015-11-30 ENCOUNTER — Other Ambulatory Visit: Payer: Self-pay

## 2015-11-30 DIAGNOSIS — M79602 Pain in left arm: Secondary | ICD-10-CM

## 2015-11-30 MED ORDER — IBUPROFEN 800 MG PO TABS: 800.0000 mg | ORAL_TABLET | Freq: Three times a day (TID) | ORAL | Status: DC | PRN

## 2016-01-05 ENCOUNTER — Encounter: Payer: Self-pay | Admitting: Family

## 2016-01-05 DIAGNOSIS — I10 Essential (primary) hypertension: Secondary | ICD-10-CM

## 2016-01-05 MED ORDER — NEBIVOLOL HCL 5 MG PO TABS: 5.0000 mg | ORAL_TABLET | Freq: Every day | ORAL | Status: DC

## 2016-01-06 ENCOUNTER — Other Ambulatory Visit: Payer: Self-pay | Admitting: Family

## 2016-01-07 ENCOUNTER — Other Ambulatory Visit: Payer: Self-pay | Admitting: Family

## 2016-04-05 ENCOUNTER — Other Ambulatory Visit: Payer: Self-pay | Admitting: Family

## 2016-07-05 ENCOUNTER — Other Ambulatory Visit: Payer: Self-pay | Admitting: Family

## 2016-07-12 ENCOUNTER — Ambulatory Visit (INDEPENDENT_AMBULATORY_CARE_PROVIDER_SITE_OTHER): Payer: 59 | Admitting: Family

## 2016-07-12 ENCOUNTER — Encounter: Payer: Self-pay | Admitting: Family

## 2016-07-12 VITALS — BP 142/84 | HR 72 | Temp 97.8°F | Resp 16 | Ht 66.0 in | Wt 159.8 lb

## 2016-07-12 DIAGNOSIS — I10 Essential (primary) hypertension: Secondary | ICD-10-CM | POA: Diagnosis not present

## 2016-07-12 MED ORDER — BYSTOLIC 5 MG PO TABS: ORAL_TABLET | ORAL | 0 refills | Status: DC

## 2016-07-12 NOTE — Assessment & Plan Note (Signed)
Blood pressure remains elevated above goal 140/90 with current regimen although close to goal. Question patient compliance with medication regimen based on medication refill pattern. Continue current dosage of Bystolic. Encouraged to decrease sodium in diet. Monitor blood pressure at home. Denies symptoms of worsening take her life with no evidence of end organ damage and physical exam. Due for blood work at next office visit. Continue to monitor.

## 2016-07-12 NOTE — Progress Notes (Signed)
Subjective:    Patient ID: Melanie Douglas, female    DOB: [birth year], 60 y.o.   MRN: [id number]  Chief Complaint  Patient presents with  . Medication Refill    needs refill of bystolic    HPI:  Melanie Douglas is a 60 y.o. female who  has a past medical history of Chicken pox; Hyperlipidemia; Hypertension; and UTI (lower urinary tract infection). and presents today for a follow up office visit.   1.) Hypertension -  Currently maintained on Bystolic. Reports taking the medication as prescribed and denies adverse side effects. Will occasionally check her blood pressure at home with an average of around 130/74. Denies worst headache of life or symptoms of end organ damage. Last eye exam was about 1 year ago.   BP Readings from Last 3 Encounters:  07/12/16 (!) 142/84  09/18/15 (!) 158/104  08/31/15 (!) 160/102     No Known Allergies    Outpatient Medications Prior to Visit  Medication Sig Dispense Refill  . ibuprofen (ADVIL,MOTRIN) 800 MG tablet TAKE 1 TABLET BY MOUTH EVERY 8 HOURS AS NEEDED 90 tablet 0  . BYSTOLIC 5 MG tablet TAKE 1 TABLET (5 MG TOTAL) BY MOUTH DAILY. 30 tablet 2  . BYSTOLIC 5 MG tablet TAKE 1 TABLET BY MOUTH EVERY DAY 30 tablet 2  . predniSONE (STERAPRED UNI-PAK 21 TAB) 10 MG (21) TBPK tablet Take 6 tablets x 1 day, 5 tablets x 1 day, 4 tablets x 1 day, 3 tablets x 1 day, 2 tablets x 1 day, 1 tablet x 1 day 21 tablet 0   No facility-administered medications prior to visit.      Review of Systems  Constitutional: Negative for chills and fever.  Eyes:       Negative for changes in vision  Respiratory: Negative for cough, chest tightness and wheezing.   Cardiovascular: Negative for chest pain, palpitations and leg swelling.  Neurological: Negative for dizziness, weakness and light-headedness.      Objective:    BP (!) 142/84 (BP Location: Left Arm, Patient Position: Sitting, Cuff Size: Normal)   Pulse 72   Temp 97.8 F (36.6 C) (Oral)   Resp 16   Ht 5\' 6"   (1.676 m)   Wt 159 lb 12.8 oz (72.5 kg)   SpO2 97%   BMI 25.79 kg/m  Nursing note and vital signs reviewed.  Physical Exam  Constitutional: She is oriented to person, place, and time. She appears well-developed and well-nourished. No distress.  Cardiovascular: Normal rate, regular rhythm, normal heart sounds and intact distal pulses.   Pulmonary/Chest: Effort normal and breath sounds normal.  Neurological: She is alert and oriented to person, place, and time.  Skin: Skin is warm and dry.  Psychiatric: She has a normal mood and affect. Her behavior is normal. Judgment and thought content normal.       Assessment & Plan:   Problem List Items Addressed This Visit      Cardiovascular and Mediastinum   Essential hypertension - Primary    Blood pressure remains elevated above goal 140/90 with current regimen although close to goal. Question patient compliance with medication regimen based on medication refill pattern. Continue current dosage of Bystolic. Encouraged to decrease sodium in diet. Monitor blood pressure at home. Denies symptoms of worsening take her life with no evidence of end organ damage and physical exam. Due for blood work at next office visit. Continue to monitor.      Relevant Medications   BYSTOLIC 5  MG tablet    Other Visit Diagnoses   None.      I have discontinued Ms. Brys's predniSONE. I am also having her maintain her ibuprofen and BYSTOLIC.   Meds ordered this encounter  Medications  . BYSTOLIC 5 MG tablet    Sig: TAKE 1 TABLET (5 MG TOTAL) BY MOUTH DAILY.    Dispense:  90 tablet    Refill:  0    Order Specific Question:   Supervising Provider    Answer:   Pricilla Holm A [id number]     Follow-up: Return in about 3 months (around 10/11/2016).  Mauricio Po, FNP

## 2016-07-12 NOTE — Patient Instructions (Addendum)
Thank you for choosing [facility].  SUMMARY AND INSTRUCTIONS:  Please continue to take the bystolic as prescribed.   Be sure to follow a low sodium diet.   Medication:  Your prescription(s) have been submitted to your pharmacy or been printed and provided for you. Please take as directed and contact our office if you believe you are having problem(s) with the medication(s) or have any questions.  Follow up:  If your symptoms worsen or fail to improve, please contact our office for further instruction, or in case of emergency go directly to the emergency room at the closest medical facility.

## 2016-10-09 ENCOUNTER — Other Ambulatory Visit: Payer: Self-pay | Admitting: Family

## 2016-10-13 ENCOUNTER — Ambulatory Visit (INDEPENDENT_AMBULATORY_CARE_PROVIDER_SITE_OTHER): Payer: 59 | Admitting: Family

## 2016-10-13 ENCOUNTER — Encounter: Payer: Self-pay | Admitting: Family

## 2016-10-13 VITALS — BP 128/82 | HR 77 | Temp 97.7°F | Resp 16 | Ht 66.0 in | Wt 161.0 lb

## 2016-10-13 DIAGNOSIS — I10 Essential (primary) hypertension: Secondary | ICD-10-CM

## 2016-10-13 NOTE — Progress Notes (Signed)
   Subjective:    Patient ID: Melanie Douglas, female    DOB: [birth year], 60 y.o.   MRN: [id number]  Chief Complaint  Patient presents with  . Follow-up    hypertension    HPI:  Melanie Douglas is a 60 y.o. female who  has a past medical history of Chicken pox; Hyperlipidemia; Hypertension; and UTI (lower urinary tract infection). and presents today for a follow up.   Hypertension - Currently maintained on Bystolic. Reports taking the medication as prescribed and denies adverse side effects. Blood pressures at home are 110-115/86. Denies worst headache of life or new symptoms of end organ. Will have occasional dizziness. Working on following a low sodium diet.   BP Readings from Last 3 Encounters:  10/13/16 128/82  07/12/16 (!) 142/84  09/18/15 (!) 158/104     No Known Allergies    Outpatient Medications Prior to Visit  Medication Sig Dispense Refill  . BYSTOLIC 5 MG tablet TAKE 1 TABLET BY MOUTH EVERY DAY 90 tablet 0  . ibuprofen (ADVIL,MOTRIN) 800 MG tablet TAKE 1 TABLET BY MOUTH EVERY 8 HOURS AS NEEDED 90 tablet 0   No facility-administered medications prior to visit.      Review of Systems  Constitutional: Negative for chills and fever.  Eyes:       Negative for changes in vision  Respiratory: Negative for cough, chest tightness and wheezing.   Cardiovascular: Negative for chest pain, palpitations and leg swelling.  Neurological: Negative for dizziness, weakness and light-headedness.      Objective:    BP 128/82 (BP Location: Left Arm, Patient Position: Sitting, Cuff Size: Normal)   Pulse 77   Temp 97.7 F (36.5 C) (Oral)   Resp 16   Ht 5\' 6"  (1.676 m)   Wt 161 lb (73 kg)   SpO2 98%   BMI 25.99 kg/m  Nursing note and vital signs reviewed.  Physical Exam  Constitutional: She is oriented to person, place, and time. She appears well-developed and well-nourished. No distress.  Cardiovascular: Normal rate, regular rhythm, normal heart sounds and intact distal  pulses.   Pulmonary/Chest: Effort normal and breath sounds normal.  Neurological: She is alert and oriented to person, place, and time.  Skin: Skin is warm and dry.  Psychiatric: She has a normal mood and affect. Her behavior is normal. Judgment and thought content normal.       Assessment & Plan:   Problem List Items Addressed This Visit      Cardiovascular and Mediastinum   Essential hypertension - Primary    Blood pressure with improved control and below goal of 140/90 at home and in office. No adverse side effects or hypotensive readings. Continue current dosage of Bystolic. Monitor blood pressure at home and follow low sodium diet.           I am having [name] maintain her ibuprofen and BYSTOLIC.   Follow-up: Return in about 6 months (around 04/13/2017), or if symptoms worsen or fail to improve.  Mauricio Po, FNP

## 2016-10-13 NOTE — Assessment & Plan Note (Signed)
Blood pressure with improved control and below goal of 140/90 at home and in office. No adverse side effects or hypotensive readings. Continue current dosage of Bystolic. Monitor blood pressure at home and follow low sodium diet.

## 2016-10-13 NOTE — Patient Instructions (Signed)
Thank you for choosing [facility].  SUMMARY AND INSTRUCTIONS:  Continue to monitor your blood pressure at home.  Follow low sodium diet.  HAPPY HOLIDAYS!  Medication:  Please continue to take your medications.  Referrals:  Referrals have been made during this visit. You should expect to hear back from our schedulers in about 7-10 days in regards to establishing an appointment with the specialists we discussed.   Follow up:  If your symptoms worsen or fail to improve, please contact our office for further instruction, or in case of emergency go directly to the emergency room at the closest medical facility.

## 2017-01-08 ENCOUNTER — Other Ambulatory Visit: Payer: Self-pay | Admitting: Family

## 2017-04-11 ENCOUNTER — Other Ambulatory Visit: Payer: Self-pay | Admitting: Family

## 2017-05-14 ENCOUNTER — Other Ambulatory Visit: Payer: Self-pay | Admitting: Family

## 2017-05-18 ENCOUNTER — Ambulatory Visit (INDEPENDENT_AMBULATORY_CARE_PROVIDER_SITE_OTHER): Payer: 59 | Admitting: Family

## 2017-05-18 ENCOUNTER — Encounter: Payer: Self-pay | Admitting: Family

## 2017-05-18 DIAGNOSIS — I10 Essential (primary) hypertension: Secondary | ICD-10-CM

## 2017-05-18 MED ORDER — NEBIVOLOL HCL 5 MG PO TABS: 5.0000 mg | ORAL_TABLET | Freq: Every day | ORAL | 3 refills | Status: DC

## 2017-05-18 NOTE — Progress Notes (Signed)
Subjective:    Patient ID: Melanie Douglas, female    DOB: 1956-11-05, 61 y.o.   MRN: [id number]  Chief Complaint  Patient presents with  . Medication Refill    bystolic refill    HPI:  Melanie Douglas is a 61 y.o. female who  has a past medical history of Chicken pox; Hyperlipidemia; Hypertension; and UTI (lower urinary tract infection). and presents today for a follow up office visit.   1.) Hypertension - Currently maintained on Bystolic and reports taking the medications as prescribed and denies adverse side effects or hypotensive readings. Blood pressures at home have been well controlled. Denies changes in vision, worst headache of life or new symptoms of end organ damage. Working on following a low sodium diet.   BP Readings from Last 3 Encounters:  05/18/17 136/86  10/13/16 128/82  07/12/16 (!) 142/84    No Known Allergies    Outpatient Medications Prior to Visit  Medication Sig Dispense Refill  . ibuprofen (ADVIL,MOTRIN) 800 MG tablet TAKE 1 TABLET BY MOUTH EVERY 8 HOURS AS NEEDED 90 tablet 0  . BYSTOLIC 5 MG tablet TAKE 1 TABLET (5 MG TOTAL) BY MOUTH DAILY. FOLLOW-UP APPT IS DUE MUST SEE MD FOR REFILLS 30 tablet 0   No facility-administered medications prior to visit.      Review of Systems  Constitutional: Negative for chills and fever.  Eyes:       Negative for changes in vision  Respiratory: Negative for cough, chest tightness and wheezing.   Cardiovascular: Negative for chest pain, palpitations and leg swelling.  Neurological: Negative for dizziness, weakness and light-headedness.      Objective:    BP 136/86 (BP Location: Left Arm, Patient Position: Sitting, Cuff Size: Normal)   Pulse 75   Temp 97.8 F (36.6 C) (Oral)   Resp 16   Ht 5\' 6"  (1.676 m)   Wt 164 lb 12.8 oz (74.8 kg)   SpO2 96%   BMI 26.60 kg/m  Nursing note and vital signs reviewed.  Physical Exam  Constitutional: She is oriented to person, place, and time. She appears well-developed and  well-nourished. No distress.  Cardiovascular: Normal rate, regular rhythm, normal heart sounds and intact distal pulses.   Pulmonary/Chest: Effort normal and breath sounds normal.  Neurological: She is alert and oriented to person, place, and time.  Skin: Skin is warm and dry.  Psychiatric: She has a normal mood and affect. Her behavior is normal. Judgment and thought content normal.       Assessment & Plan:   Problem List Items Addressed This Visit      Cardiovascular and Mediastinum   Essential hypertension    Blood pressure remains well-controlled with current medication regimen below goal 140/90. No adverse side effects. Denies worse headache of life with no new symptoms of end organ damage noted on physical exam. Continue current dosage of nebivolol. Encouraged to monitor blood pressures at home and follow low-sodium diet. Follow-up in one year or sooner if needed.      Relevant Medications   nebivolol (BYSTOLIC) 5 MG tablet       I have changed Melanie Douglas's BYSTOLIC to nebivolol. I am also having her maintain her ibuprofen.   Meds ordered this encounter  Medications  . nebivolol (BYSTOLIC) 5 MG tablet    Sig: Take 1 tablet (5 mg total) by mouth daily.    Dispense:  90 tablet    Refill:  3    Order Specific Question:  Supervising Provider    Answer:   Pricilla Holm A [[id number]]     Follow-up: Return in about 1 year (around 05/18/2018), or if symptoms worsen or fail to improve.  Mauricio Po, FNP

## 2017-05-18 NOTE — Patient Instructions (Addendum)
Thank you for choosing [facility].  SUMMARY AND INSTRUCTIONS:  Please continue to take your blood pressure once daily at differing times throughout the day.  Continue to follow a low sodium diet.   Follow up for your annual wellness exam when due.   Medication:  Your prescription(s) have been submitted to your pharmacy or been printed and provided for you. Please take as directed and contact our office if you believe you are having problem(s) with the medication(s) or have any questions. [0 Follow up:  If your symptoms worsen or fail to improve, please contact our office for further instruction, or in case of emergency go directly to the emergency room at the closest medical facility.

## 2017-05-18 NOTE — Assessment & Plan Note (Signed)
Blood pressure remains well-controlled with current medication regimen below goal 140/90. No adverse side effects. Denies worse headache of life with no new symptoms of end organ damage noted on physical exam. Continue current dosage of nebivolol. Encouraged to monitor blood pressures at home and follow low-sodium diet. Follow-up in one year or sooner if needed.

## 2017-08-22 ENCOUNTER — Other Ambulatory Visit: Payer: Self-pay | Admitting: Family

## 2018-03-02 DIAGNOSIS — D485 Neoplasm of uncertain behavior of skin: Secondary | ICD-10-CM | POA: Diagnosis not present

## 2018-04-15 DIAGNOSIS — B999 Unspecified infectious disease: Secondary | ICD-10-CM | POA: Diagnosis not present

## 2018-06-12 ENCOUNTER — Other Ambulatory Visit: Payer: Self-pay | Admitting: Family

## 2018-06-12 ENCOUNTER — Encounter: Payer: Self-pay | Admitting: Family

## 2018-06-12 ENCOUNTER — Other Ambulatory Visit (INDEPENDENT_AMBULATORY_CARE_PROVIDER_SITE_OTHER): Payer: BLUE CROSS/BLUE SHIELD

## 2018-06-12 ENCOUNTER — Other Ambulatory Visit: Payer: BLUE CROSS/BLUE SHIELD

## 2018-06-12 ENCOUNTER — Other Ambulatory Visit (HOSPITAL_COMMUNITY)
Admission: RE | Admit: 2018-06-12 | Discharge: 2018-06-12 | Disposition: A | Discharge status: Home / Self Care | Payer: BLUE CROSS/BLUE SHIELD | Source: Ambulatory Visit | Attending: Family | Admitting: Family

## 2018-06-12 ENCOUNTER — Ambulatory Visit: Payer: BLUE CROSS/BLUE SHIELD | Admitting: Family

## 2018-06-12 VITALS — BP 126/84 | HR 68 | Temp 98.0°F | Ht 66.0 in | Wt 171.0 lb

## 2018-06-12 DIAGNOSIS — Z0000 Encounter for general adult medical examination without abnormal findings: Secondary | ICD-10-CM

## 2018-06-12 DIAGNOSIS — Z1159 Encounter for screening for other viral diseases: Secondary | ICD-10-CM | POA: Diagnosis not present

## 2018-06-12 DIAGNOSIS — E785 Hyperlipidemia, unspecified: Secondary | ICD-10-CM | POA: Insufficient documentation

## 2018-06-12 DIAGNOSIS — Z79899 Other long term (current) drug therapy: Secondary | ICD-10-CM | POA: Insufficient documentation

## 2018-06-12 DIAGNOSIS — Z87891 Personal history of nicotine dependence: Secondary | ICD-10-CM | POA: Diagnosis not present

## 2018-06-12 DIAGNOSIS — I10 Essential (primary) hypertension: Secondary | ICD-10-CM | POA: Insufficient documentation

## 2018-06-12 DIAGNOSIS — Z1231 Encounter for screening mammogram for malignant neoplasm of breast: Secondary | ICD-10-CM

## 2018-06-12 DIAGNOSIS — E559 Vitamin D deficiency, unspecified: Secondary | ICD-10-CM | POA: Diagnosis not present

## 2018-06-12 DIAGNOSIS — Z13220 Encounter for screening for lipoid disorders: Secondary | ICD-10-CM

## 2018-06-12 DIAGNOSIS — Z1211 Encounter for screening for malignant neoplasm of colon: Secondary | ICD-10-CM

## 2018-06-12 DIAGNOSIS — Z124 Encounter for screening for malignant neoplasm of cervix: Secondary | ICD-10-CM

## 2018-06-12 LAB — COMPREHENSIVE METABOLIC PANEL
ALBUMIN: 4.4 g/dL (ref 3.5–5.2)
ALK PHOS: 54 U/L (ref 39–117)
ALT: 11 U/L (ref 0–35)
AST: 9 U/L (ref 0–37)
BUN: 16 mg/dL (ref 6–23)
CALCIUM: 9.8 mg/dL (ref 8.4–10.5)
CHLORIDE: 106 meq/L (ref 96–112)
CO2: 26 mEq/L (ref 19–32)
Creatinine, Ser: 0.93 mg/dL (ref 0.40–1.20)
GFR: 65.11 mL/min (ref >60.00)
Glucose, Bld: 108 mg/dL — ABNORMAL HIGH (ref 70–99)
POTASSIUM: 4.4 meq/L (ref 3.5–5.1)
Sodium: 140 mEq/L (ref 135–145)
TOTAL PROTEIN: 7.4 g/dL (ref 6.0–8.3)
Total Bilirubin: 0.4 mg/dL (ref 0.2–1.2)

## 2018-06-12 LAB — CBC WITH DIFFERENTIAL/PLATELET
BASOS ABS: 0.1 10*3/uL (ref 0.0–0.1)
BASOS PCT: 1.2 % (ref 0.0–3.0)
EOS ABS: 0.1 10*3/uL (ref 0.0–0.7)
Eosinophils Relative: 2.1 % (ref 0.0–5.0)
HCT: 41 % (ref 36.0–46.0)
HEMOGLOBIN: 13.9 g/dL (ref 12.0–15.0)
LYMPHS PCT: 30.6 % (ref 12.0–46.0)
Lymphs Abs: 2.1 10*3/uL (ref 0.7–4.0)
MCHC: 33.9 g/dL (ref 30.0–36.0)
MCV: 92.5 fl (ref 78.0–100.0)
Monocytes Absolute: 0.6 10*3/uL (ref 0.1–1.0)
Monocytes Relative: 9.1 % (ref 3.0–12.0)
Neutro Abs: 3.9 10*3/uL (ref 1.4–7.7)
Neutrophils Relative %: 57 % (ref 43.0–77.0)
Platelets: 275 10*3/uL (ref 150.0–400.0)
RBC: 4.43 Mil/uL (ref 3.87–5.11)
RDW: 13.3 % (ref 11.5–15.5)
WBC: 6.8 10*3/uL (ref 4.0–10.5)

## 2018-06-12 LAB — HEPATITIS C ANTIBODY
Hepatitis C Ab: NONREACTIVE
SIGNAL TO CUT-OFF: 0.19 (ref <1.00)

## 2018-06-12 LAB — LIPID PANEL
CHOLESTEROL: 215 mg/dL — AB (ref 0–200)
HDL: 52.9 mg/dL (ref >39.00)
NonHDL: 162.38
TRIGLYCERIDES: 244 mg/dL — AB (ref 0.0–149.0)
Total CHOL/HDL Ratio: 4
VLDL: 48.8 mg/dL — AB (ref 0.0–40.0)

## 2018-06-12 LAB — LDL CHOLESTEROL, DIRECT: Direct LDL: 143 mg/dL

## 2018-06-12 LAB — VITAMIN D 25 HYDROXY (VIT D DEFICIENCY, FRACTURES): VITD: 22.64 ng/mL — AB (ref 30.00–100.00)

## 2018-06-12 MED ORDER — IBUPROFEN 800 MG PO TABS: 800.0000 mg | ORAL_TABLET | Freq: Three times a day (TID) | ORAL | 0 refills | Status: DC | PRN

## 2018-06-12 MED ORDER — NEBIVOLOL HCL 5 MG PO TABS: 5.0000 mg | ORAL_TABLET | Freq: Every day | ORAL | 3 refills | Status: DC

## 2018-06-12 NOTE — Patient Instructions (Signed)

## 2018-06-12 NOTE — Progress Notes (Signed)
Melanie Douglas is a 62 y.o. female with the following history as recorded in [employer]:  Patient Active Problem List   Diagnosis Date Noted  . Essential hypertension 09/18/2015  . Left arm pain 08/31/2015  . Lung nodule 08/31/2015    Current Outpatient Medications  Medication Sig Dispense Refill  . ibuprofen (ADVIL,MOTRIN) 800 MG tablet Take 1 tablet (800 mg total) by mouth every 8 (eight) hours as needed. 90 tablet 0  . nebivolol (BYSTOLIC) 5 MG tablet Take 1 tablet (5 mg total) by mouth daily. 90 tablet 3   No current facility-administered medications for this visit.     Allergies: Patient has no known allergies.  Past Medical History:  Diagnosis Date  . Chicken pox   . Hyperlipidemia   . Hypertension   . UTI (lower urinary tract infection)     History reviewed. No pertinent surgical history.  Family History  Problem Relation Age of Onset  . Hyperlipidemia Mother   . Hypertension Mother   . Healthy Father   . Healthy Maternal Grandmother   . Prostate cancer Maternal Grandfather   . Healthy Paternal Grandmother   . Healthy Paternal Grandfather     Social History   Tobacco Use  . Smoking status: Former Smoker    Packs/day: 1.00    Years: 40.00    Pack years: 40.00    Types: Cigarettes  . Smokeless tobacco: Never Used  Substance Use Topics  . Alcohol use: Yes    Alcohol/week: 8.4 oz    Types: 14 Glasses of wine per week    Comment: daily    Subjective:  Patient presents today for yearly exam; in baseline state of health; overdue for majority of preventive healthcare needs- agreeable to scheduling; does not want to do vaccines; has eye doctor and dentist;   Review of Systems  Constitutional: Negative.   HENT: Negative.   Eyes: Negative.   Respiratory: Negative.   Cardiovascular: Negative.   Gastrointestinal: Negative.   Musculoskeletal: Negative.   Skin: Negative.   Neurological: Negative.   Psychiatric/Behavioral: Negative.       Objective:  Vitals:   06/12/18 0951  BP: 126/84  Pulse: 68  Temp: 98 F (36.7 C)  TempSrc: Oral  SpO2: 95%  Weight: 171 lb (77.6 kg)  Height: '5\' 6"'  (1.676 m)    General: Well developed, well nourished, in no acute distress  Skin : Warm and dry.  Head: Normocephalic and atraumatic  Eyes: Sclera and conjunctiva clear; pupils round and reactive to light; extraocular movements intact  Ears: External normal; canals clear; tympanic membranes normal  Oropharynx: Pink, supple. No suspicious lesions  Neck: Supple without thyromegaly, adenopathy  Lungs: Respirations unlabored; clear to auscultation bilaterally without wheeze, rales, rhonchi  CVS exam: normal rate and regular rhythm.  Abdomen: Soft; nontender; nondistended; normoactive bowel sounds; no masses or hepatosplenomegaly  Musculoskeletal: No deformities; no active joint inflammation  Extremities: No edema, cyanosis, clubbing  Vessels: Symmetric bilaterally  Neurologic: Alert and oriented; speech intact; face symmetrical; moves all extremities well; CNII-XII intact without focal deficit  Pelvic exam: normal external genitalia, vulva, vagina, cervix, uterus and adnexa.    Assessment:  1. PE (physical exam), annual   2. Essential hypertension   3. Screening mammogram, encounter for   4. Lipid screening   5. Vitamin D deficiency   6. Encounter for hepatitis C screening test for low risk patient   7. Cervical cancer screening     Plan:  Age appropriate preventive healthcare needs addressed;  encouraged regular eye doctor and dental exams; encouraged regular exercise; will update labs and refills as needed today; follow-up to be determined; Refills updated; patient defers vaccines today; labs updated; follow-up in 1 year, sooner prn.   No follow-ups on file.  Orders Placed This Encounter  Procedures  . MM Digital Screening    Standing Status:   Future    Standing Expiration Date:   08/13/2019    Order Specific Question:   Reason for Exam (SYMPTOM   OR DIAGNOSIS REQUIRED)    Answer:   screening mammogram    Order Specific Question:   Preferred imaging location?    Answer:   [facility]  . CBC w/Diff    Standing Status:   Future    Number of Occurrences:   1    Standing Expiration Date:   06/12/2019  . Comp Met (CMET)    Standing Status:   Future    Number of Occurrences:   1    Standing Expiration Date:   06/12/2019  . Lipid panel    Standing Status:   Future    Number of Occurrences:   1    Standing Expiration Date:   06/13/2019  . Vitamin D (25 hydroxy)    Standing Status:   Future    Number of Occurrences:   1    Standing Expiration Date:   06/12/2019  . Hepatitis C Antibody    Standing Status:   Future    Number of Occurrences:   1    Standing Expiration Date:   06/12/2019  . Ambulatory referral to Gastroenterology    Referral Priority:   Routine    Referral Type:   Consultation    Referral Reason:   Specialty Services Required    Number of Visits Requested:   1    Requested Prescriptions   Signed Prescriptions Disp Refills  . nebivolol (BYSTOLIC) 5 MG tablet 90 tablet 3    Sig: Take 1 tablet (5 mg total) by mouth daily.  Marland Kitchen ibuprofen (ADVIL,MOTRIN) 800 MG tablet 90 tablet 0    Sig: Take 1 tablet (800 mg total) by mouth every 8 (eight) hours as needed.

## 2018-06-13 ENCOUNTER — Other Ambulatory Visit: Payer: Self-pay | Admitting: Family

## 2018-06-13 ENCOUNTER — Encounter: Payer: Self-pay | Admitting: Family

## 2018-06-13 DIAGNOSIS — Z1231 Encounter for screening mammogram for malignant neoplasm of breast: Secondary | ICD-10-CM

## 2018-06-13 MED ORDER — VITAMIN D (ERGOCALCIFEROL) 1.25 MG (50000 UNIT) PO CAPS: 50000.0000 [IU] | ORAL_CAPSULE | ORAL | 0 refills | Status: AC

## 2018-06-14 LAB — CYTOLOGY - PAP
Bacterial vaginitis: NEGATIVE
CANDIDA VAGINITIS: NEGATIVE
DIAGNOSIS: NEGATIVE
HPV (WINDOPATH): NOT DETECTED

## 2018-06-19 ENCOUNTER — Encounter: Payer: Self-pay | Admitting: Gastroenterology

## 2018-07-02 ENCOUNTER — Ambulatory Visit (AMBULATORY_SURGERY_CENTER): Payer: Self-pay

## 2018-07-02 ENCOUNTER — Encounter: Payer: Self-pay | Admitting: Gastroenterology

## 2018-07-02 VITALS — Ht 66.0 in | Wt 171.8 lb

## 2018-07-02 DIAGNOSIS — Z1211 Encounter for screening for malignant neoplasm of colon: Secondary | ICD-10-CM

## 2018-07-02 MED ORDER — NA SULFATE-K SULFATE-MG SULF 17.5-3.13-1.6 GM/177ML PO SOLN: 1.0000 | Freq: Once | ORAL | 0 refills | Status: AC

## 2018-07-02 NOTE — Progress Notes (Signed)
No egg or soy allergy known to patient  No issues with past sedation with any surgeries  or procedures, no intubation problems  No diet pills per patient No home 02 use per patient  No blood thinners per patient  Pt denies issues with constipation  No A fib or A flutter  EMMI video sent to pt's e mail  Pt. declined 

## 2018-07-10 ENCOUNTER — Inpatient Hospital Stay: Admission: RE | Admit: 2018-07-10 | Payer: BLUE CROSS/BLUE SHIELD | Source: Ambulatory Visit

## 2018-07-16 ENCOUNTER — Encounter: Payer: Self-pay | Admitting: Gastroenterology

## 2018-07-16 ENCOUNTER — Ambulatory Visit (AMBULATORY_SURGERY_CENTER): Payer: BLUE CROSS/BLUE SHIELD | Admitting: Gastroenterology

## 2018-07-16 VITALS — BP 123/75 | HR 66 | Temp 97.3°F | Resp 9 | Ht 66.0 in | Wt 171.0 lb

## 2018-07-16 DIAGNOSIS — Z1211 Encounter for screening for malignant neoplasm of colon: Secondary | ICD-10-CM

## 2018-07-16 DIAGNOSIS — D120 Benign neoplasm of cecum: Secondary | ICD-10-CM

## 2018-07-16 DIAGNOSIS — D122 Benign neoplasm of ascending colon: Secondary | ICD-10-CM | POA: Diagnosis not present

## 2018-07-16 HISTORY — PX: COLONOSCOPY: SHX174

## 2018-07-16 MED ORDER — SODIUM CHLORIDE 0.9 % IV SOLN: 500.0000 mL | Freq: Once | INTRAVENOUS | Status: DC

## 2018-07-16 NOTE — Patient Instructions (Signed)
YOU HAD AN ENDOSCOPIC PROCEDURE TODAY AT [facility]:   Refer to the procedure report that was given to you for any specific questions about what was found during the examination.  If the procedure report does not answer your questions, please call your gastroenterologist to clarify.  If you requested that your care partner not be given the details of your procedure findings, then the procedure report has been included in a sealed envelope for you to review at your convenience later.  YOU SHOULD EXPECT: Some feelings of bloating in the abdomen. Passage of more gas than usual.  Walking can help get rid of the air that was put into your GI tract during the procedure and reduce the bloating. If you had a lower endoscopy (such as a colonoscopy or flexible sigmoidoscopy) you may notice spotting of blood in your stool or on the toilet paper. If you underwent a bowel prep for your procedure, you may not have a normal bowel movement for a few days.  Please Note:  You might notice some irritation and congestion in your nose or some drainage.  This is from the oxygen used during your procedure.  There is no need for concern and it should clear up in a day or so.  SYMPTOMS TO REPORT IMMEDIATELY:   Following lower endoscopy (colonoscopy or flexible sigmoidoscopy):  Excessive amounts of blood in the stool  Significant tenderness or worsening of abdominal pains  Swelling of the abdomen that is new, acute  Fever of 100F or higher  Please see handouts given to you on polyps, Diverticulosis and Hemorrhoids.  For urgent or emergent issues, a gastroenterologist can be reached at any hour by calling [phone].   DIET:  We do recommend a small meal at first, but then you may proceed to your regular diet.  Drink plenty of fluids but you should avoid alcoholic beverages for 24 hours.  ACTIVITY:  You should plan to take it easy for the rest of today and you should NOT DRIVE or use heavy  machinery until tomorrow (because of the sedation medicines used during the test).    FOLLOW UP: Our staff will call the number listed on your records the next business day following your procedure to check on you and address any questions or concerns that you may have regarding the information given to you following your procedure. If we do not reach you, we will leave a message.  However, if you are feeling well and you are not experiencing any problems, there is no need to return our call.  We will assume that you have returned to your regular daily activities without incident.  If any biopsies were taken you will be contacted by phone or by letter within the next 1-3 weeks.  Please call us at [phone] if you have not heard about the biopsies in 3 weeks.    SIGNATURES/CONFIDENTIALITY: You and/or your care partner have signed paperwork which will be entered into your electronic medical record.  These signatures attest to the fact that that the information above on your After Visit Summary has been reviewed and is understood.  Full responsibility of the confidentiality of this discharge information lies with you and/or your care-partner.  Thank you for letting us take care of your healthcare needs today.

## 2018-07-16 NOTE — Op Note (Signed)
[facility] Patient Name: Melanie Douglas Procedure Date: 07/16/2018 7:49 AM MRN: [id number] Endoscopist: Jackquline Denmark , MD Age: 62 Referring MD:  Date of Birth: 27-Mar-1956 Gender: Female Account #: [id number] Procedure:                Colonoscopy Indications:              Screening for colorectal malignant neoplasm Medicines:                Monitored Anesthesia Care Procedure:                Pre-Anesthesia Assessment:                           - Prior to the procedure, a History and Physical                            was performed, and patient medications and                            allergies were reviewed. The patient's tolerance of                            previous anesthesia was also reviewed. The risks                            and benefits of the procedure and the sedation                            options and risks were discussed with the patient.                            All questions were answered, and informed consent                            was obtained. Prior Anticoagulants: The patient has                            taken no previous anticoagulant or antiplatelet                            agents. ASA Grade Assessment: I - A normal, healthy                            patient. After reviewing the risks and benefits,                            the patient was deemed in satisfactory condition to                            undergo the procedure.                           After obtaining informed consent, the colonoscope  was passed under direct vision. Throughout the                            procedure, the patient's blood pressure, pulse, and                            oxygen saturations were monitored continuously. The                            Model PCF-[id number]) scope was introduced                            through the anus and advanced to the the cecum,                            identified by appendiceal orifice  and ileocecal                            valve. The colonoscopy was performed without                            difficulty. The patient tolerated the procedure                            well. The quality of the bowel preparation was good. Scope In: 8:04:25 AM Scope Out: 8:15:59 AM Scope Withdrawal Time: 0 hours 8 minutes 56 seconds  Total Procedure Duration: 0 hours 11 minutes 34 seconds  Findings:                 A 6 mm polyp was found in the cecum. The polyp was                            sessile. The polyp was removed with a cold snare.                            Resection and retrieval were complete. Estimated                            blood loss: none.                           A 10 mm polyp was found in the proximal ascending                            colon. The polyp was sessile. The polyp was removed                            with a hot snare. Resection and retrieval were                            complete. Estimated blood loss: none.  A few small-mouthed diverticula were found in the                            sigmoid colon.                           Non-bleeding internal hemorrhoids were found. The                            hemorrhoids were small.                           The exam was otherwise without abnormality on                            direct and retroflexion views. Complications:            No immediate complications. Estimated Blood Loss:     Estimated blood loss: none. Impression:               - Colonic polyps status post polypectomy                           - Mild sigmoid diverticulosis. Recommendation:           - Patient has a contact number available for                            emergencies. The signs and symptoms of potential                            delayed complications were discussed with the                            patient. Return to normal activities tomorrow.                            Written discharge  instructions were provided to the                            patient.                           - Resume previous diet.                           - Continue present medications.                           - Await pathology results.                           - Repeat colonoscopy for surveillance based on                            pathology results.                           -  Return to GI clinic PRN. Jackquline Denmark, MD 07/16/2018 8:21:36 AM This report has been signed electronically.

## 2018-07-16 NOTE — Progress Notes (Signed)
Called to room to assist during endoscopic procedure.  Patient ID and intended procedure confirmed with present staff. Received instructions for my participation in the procedure from the performing physician.  

## 2018-07-16 NOTE — Progress Notes (Signed)
Pt's states no medical or surgical changes since previsit or office visit. 

## 2018-07-16 NOTE — Progress Notes (Signed)
Alert and oriented x3, pleased with MAC, report to RN  

## 2018-07-17 ENCOUNTER — Telehealth: Payer: Self-pay | Admitting: *Deleted

## 2018-07-17 NOTE — Telephone Encounter (Signed)
  Follow up Call-  Call back number 07/16/2018  Post procedure Call Back phone  # [phone]  Permission to leave phone message Yes  Some recent data might be hidden     Patient questions:  Do you have a fever, pain , or abdominal swelling? No. Pain Score  0 *  Have you tolerated food without any problems? Yes.    Have you been able to return to your normal activities? Yes.    Do you have any questions about your discharge instructions: Diet   No. Medications  No. Follow up visit  No.  Do you have questions or concerns about your Care? No.  Actions: * If pain score is 4 or above: No action needed, pain <4.

## 2018-07-18 ENCOUNTER — Encounter: Payer: Self-pay | Admitting: Gastroenterology

## 2018-08-01 ENCOUNTER — Ambulatory Visit
Admission: RE | Admit: 2018-08-01 | Discharge: 2018-08-01 | Disposition: A | Discharge status: Home / Self Care | Payer: BLUE CROSS/BLUE SHIELD | Source: Ambulatory Visit | Attending: Family | Admitting: Family

## 2018-08-01 DIAGNOSIS — Z1231 Encounter for screening mammogram for malignant neoplasm of breast: Secondary | ICD-10-CM

## 2018-08-02 ENCOUNTER — Other Ambulatory Visit: Payer: Self-pay | Admitting: Family

## 2018-08-02 DIAGNOSIS — R928 Other abnormal and inconclusive findings on diagnostic imaging of breast: Secondary | ICD-10-CM

## 2018-08-02 NOTE — Progress Notes (Signed)
Bilateral diagnostic mammogram and ultrasound has been ordered.

## 2018-08-14 ENCOUNTER — Other Ambulatory Visit: Payer: Self-pay | Admitting: Family

## 2018-08-14 ENCOUNTER — Ambulatory Visit
Admission: RE | Admit: 2018-08-14 | Discharge: 2018-08-14 | Disposition: A | Discharge status: Home / Self Care | Payer: BLUE CROSS/BLUE SHIELD | Source: Ambulatory Visit | Attending: Family | Admitting: Family

## 2018-08-14 DIAGNOSIS — R928 Other abnormal and inconclusive findings on diagnostic imaging of breast: Secondary | ICD-10-CM

## 2018-08-14 DIAGNOSIS — N6312 Unspecified lump in the right breast, upper inner quadrant: Secondary | ICD-10-CM | POA: Diagnosis not present

## 2018-08-14 DIAGNOSIS — N6310 Unspecified lump in the right breast, unspecified quadrant: Secondary | ICD-10-CM

## 2018-08-14 DIAGNOSIS — N6321 Unspecified lump in the left breast, upper outer quadrant: Secondary | ICD-10-CM | POA: Diagnosis not present

## 2018-08-14 DIAGNOSIS — N6311 Unspecified lump in the right breast, upper outer quadrant: Secondary | ICD-10-CM | POA: Diagnosis not present

## 2018-08-14 DIAGNOSIS — R922 Inconclusive mammogram: Secondary | ICD-10-CM | POA: Diagnosis not present

## 2018-08-21 ENCOUNTER — Ambulatory Visit
Admission: RE | Admit: 2018-08-21 | Discharge: 2018-08-21 | Disposition: A | Discharge status: Home / Self Care | Payer: BLUE CROSS/BLUE SHIELD | Source: Ambulatory Visit | Attending: Family | Admitting: Family

## 2018-08-21 DIAGNOSIS — N6311 Unspecified lump in the right breast, upper outer quadrant: Secondary | ICD-10-CM | POA: Diagnosis not present

## 2018-08-21 DIAGNOSIS — D241 Benign neoplasm of right breast: Secondary | ICD-10-CM | POA: Diagnosis not present

## 2018-08-21 DIAGNOSIS — N6310 Unspecified lump in the right breast, unspecified quadrant: Secondary | ICD-10-CM

## 2018-08-28 DIAGNOSIS — D241 Benign neoplasm of right breast: Secondary | ICD-10-CM | POA: Diagnosis not present

## 2018-09-12 ENCOUNTER — Encounter: Payer: Self-pay | Admitting: Family

## 2019-01-21 ENCOUNTER — Other Ambulatory Visit: Payer: Self-pay | Admitting: General Surgery

## 2019-01-21 DIAGNOSIS — N6310 Unspecified lump in the right breast, unspecified quadrant: Secondary | ICD-10-CM

## 2019-02-15 ENCOUNTER — Ambulatory Visit
Admission: RE | Admit: 2019-02-15 | Discharge: 2019-02-15 | Disposition: A | Discharge status: Home / Self Care | Payer: BLUE CROSS/BLUE SHIELD | Source: Ambulatory Visit | Attending: General Surgery | Admitting: General Surgery

## 2019-02-15 ENCOUNTER — Other Ambulatory Visit: Payer: Self-pay | Admitting: General Surgery

## 2019-02-15 ENCOUNTER — Other Ambulatory Visit: Payer: BLUE CROSS/BLUE SHIELD

## 2019-02-15 ENCOUNTER — Other Ambulatory Visit: Payer: Self-pay

## 2019-02-15 DIAGNOSIS — N6310 Unspecified lump in the right breast, unspecified quadrant: Secondary | ICD-10-CM

## 2019-02-15 DIAGNOSIS — N6312 Unspecified lump in the right breast, upper inner quadrant: Secondary | ICD-10-CM | POA: Diagnosis not present

## 2019-03-13 ENCOUNTER — Other Ambulatory Visit: Payer: Self-pay

## 2019-03-13 DIAGNOSIS — I10 Essential (primary) hypertension: Secondary | ICD-10-CM

## 2019-03-13 MED ORDER — NEBIVOLOL HCL 5 MG PO TABS: 5.0000 mg | ORAL_TABLET | Freq: Every day | ORAL | 3 refills | Status: DC

## 2019-06-14 ENCOUNTER — Ambulatory Visit (INDEPENDENT_AMBULATORY_CARE_PROVIDER_SITE_OTHER): Payer: BC Managed Care – PPO | Admitting: Family

## 2019-06-14 ENCOUNTER — Other Ambulatory Visit (INDEPENDENT_AMBULATORY_CARE_PROVIDER_SITE_OTHER): Payer: BC Managed Care – PPO

## 2019-06-14 ENCOUNTER — Encounter: Payer: Self-pay | Admitting: Family

## 2019-06-14 ENCOUNTER — Other Ambulatory Visit: Payer: Self-pay

## 2019-06-14 VITALS — BP 124/80 | HR 81 | Temp 98.1°F | Ht 66.0 in | Wt 170.0 lb

## 2019-06-14 DIAGNOSIS — I10 Essential (primary) hypertension: Secondary | ICD-10-CM | POA: Diagnosis not present

## 2019-06-14 DIAGNOSIS — Z13220 Encounter for screening for lipoid disorders: Secondary | ICD-10-CM

## 2019-06-14 DIAGNOSIS — E559 Vitamin D deficiency, unspecified: Secondary | ICD-10-CM | POA: Diagnosis not present

## 2019-06-14 DIAGNOSIS — Z0000 Encounter for general adult medical examination without abnormal findings: Secondary | ICD-10-CM | POA: Diagnosis not present

## 2019-06-14 DIAGNOSIS — E2839 Other primary ovarian failure: Secondary | ICD-10-CM

## 2019-06-14 LAB — CBC WITH DIFFERENTIAL/PLATELET
Basophils Absolute: 0.1 10*3/uL (ref 0.0–0.1)
Basophils Relative: 1.2 % (ref 0.0–3.0)
Eosinophils Absolute: 0.1 10*3/uL (ref 0.0–0.7)
Eosinophils Relative: 2 % (ref 0.0–5.0)
HCT: 40.9 % (ref 36.0–46.0)
Hemoglobin: 13.9 g/dL (ref 12.0–15.0)
Lymphocytes Relative: 29.8 % (ref 12.0–46.0)
Lymphs Abs: 1.9 10*3/uL (ref 0.7–4.0)
MCHC: 34 g/dL (ref 30.0–36.0)
MCV: 93.5 fl (ref 78.0–100.0)
Monocytes Absolute: 0.6 10*3/uL (ref 0.1–1.0)
Monocytes Relative: 9 % (ref 3.0–12.0)
Neutro Abs: 3.7 10*3/uL (ref 1.4–7.7)
Neutrophils Relative %: 58 % (ref 43.0–77.0)
Platelets: 237 10*3/uL (ref 150.0–400.0)
RBC: 4.37 Mil/uL (ref 3.87–5.11)
RDW: 12.9 % (ref 11.5–15.5)
WBC: 6.4 10*3/uL (ref 4.0–10.5)

## 2019-06-14 LAB — LIPID PANEL
Cholesterol: 227 mg/dL — ABNORMAL HIGH (ref 0–200)
HDL: 54.4 mg/dL (ref >39.00)
NonHDL: 172.75
Total CHOL/HDL Ratio: 4
Triglycerides: 295 mg/dL — ABNORMAL HIGH (ref 0.0–149.0)
VLDL: 59 mg/dL — ABNORMAL HIGH (ref 0.0–40.0)

## 2019-06-14 LAB — COMPREHENSIVE METABOLIC PANEL
ALT: 14 U/L (ref 0–35)
AST: 12 U/L (ref 0–37)
Albumin: 4.4 g/dL (ref 3.5–5.2)
Alkaline Phosphatase: 56 U/L (ref 39–117)
BUN: 15 mg/dL (ref 6–23)
CO2: 28 mEq/L (ref 19–32)
Calcium: 9.9 mg/dL (ref 8.4–10.5)
Chloride: 105 mEq/L (ref 96–112)
Creatinine, Ser: 0.93 mg/dL (ref 0.40–1.20)
GFR: 61.06 mL/min (ref >60.00)
Glucose, Bld: 92 mg/dL (ref 70–99)
Potassium: 4 mEq/L (ref 3.5–5.1)
Sodium: 141 mEq/L (ref 135–145)
Total Bilirubin: 0.4 mg/dL (ref 0.2–1.2)
Total Protein: 7.3 g/dL (ref 6.0–8.3)

## 2019-06-14 LAB — TSH: TSH: 2.34 u[IU]/mL (ref 0.35–4.50)

## 2019-06-14 LAB — LDL CHOLESTEROL, DIRECT: Direct LDL: 156 mg/dL

## 2019-06-14 LAB — VITAMIN D 25 HYDROXY (VIT D DEFICIENCY, FRACTURES): VITD: 67.14 ng/mL (ref 30.00–100.00)

## 2019-06-14 MED ORDER — NEBIVOLOL HCL 5 MG PO TABS: 5.0000 mg | ORAL_TABLET | Freq: Every day | ORAL | 3 refills | Status: DC

## 2019-06-14 MED ORDER — IBUPROFEN 800 MG PO TABS: 800.0000 mg | ORAL_TABLET | Freq: Three times a day (TID) | ORAL | 0 refills | Status: DC | PRN

## 2019-06-14 NOTE — Progress Notes (Signed)
Melanie Douglas is a 63 y.o. female with the following history as recorded in Epic[employer]:  Patient Active Problem List   Diagnosis Date Noted  . Essential hypertension 09/18/2015  . Left arm pain 08/31/2015  . Lung nodule 08/31/2015    Current Outpatient Medications  Medication Sig Dispense Refill  . ibuprofen (ADVIL) 800 MG tablet Take 1 tablet (800 mg total) by mouth every 8 (eight) hours as needed. 90 tablet 0  . nebivolol (BYSTOLIC) 5 MG tablet Take 1 tablet (5 mg total) by mouth daily. 90 tablet 3   No current facility-administered medications for this visit.     Allergies: Patient has no known allergies.  Past Medical History:  Diagnosis Date  . Chicken pox   . Hyperlipidemia   . Hypertension   . UTI (lower urinary tract infection)     Past Surgical History:  Procedure Laterality Date  . COLONOSCOPY      Family History  Problem Relation Age of Onset  . Hyperlipidemia Mother   . Hypertension Mother   . Healthy Father   . Healthy Maternal Grandmother   . Prostate cancer Maternal Grandfather   . Healthy Paternal Grandmother   . Healthy Paternal Grandfather   . Colon cancer Maternal Uncle   . Colon polyps Neg Hx   . Esophageal cancer Neg Hx   . Stomach cancer Neg Hx   . Rectal cancer Neg Hx   . Breast cancer Neg Hx     Social History   Tobacco Use  . Smoking status: Former Smoker    Packs/day: 1.00    Years: 40.00    Pack years: 40.00    Types: Cigarettes    Quit date: 12/02/2014    Years since quitting: 4.5  . Smokeless tobacco: Never Used  Substance Use Topics  . Alcohol use: Yes    Alcohol/week: 14.0 standard drinks    Types: 14 Glasses of wine per week    Comment: daily    Subjective:  Patient presents for yearly CPE; in baseline state of health with no concerns;  Up to date on mammogram, colonoscopy; mammogram is scheduled for later this year;  Needs refills on blood pressure medication and Ibuprofen; Denies any chest pain, shortness of breath, blurred  vision or headache. Sees eye doctor and dentist regularly;  Review of Systems  Constitutional: Negative.   HENT: Negative.   Eyes: Negative.   Respiratory: Negative for shortness of breath.   Cardiovascular: Negative for chest pain.  Gastrointestinal: Negative for abdominal pain, constipation and diarrhea.  Musculoskeletal: Negative for back pain and joint pain.  Skin: Negative.   Neurological: Negative for dizziness and headaches.  Psychiatric/Behavioral: Negative for depression.      Objective:  Vitals:   06/14/19 0954  BP: 124/80  Pulse: 81  Temp: 98.1 F (36.7 C)  TempSrc: Oral  SpO2: 98%  Weight: 170 lb (77.1 kg)  Height: _0  (1.676 m)    General: Well developed, well nourished, in no acute distress  Skin : Warm and dry.  Head: Normocephalic and atraumatic  Eyes: Sclera and conjunctiva clear; pupils round and reactive to light; extraocular movements intact  Ears: External normal; canals clear; tympanic membranes normal  Oropharynx: Pink, supple. No suspicious lesions  Neck: Supple without thyromegaly, adenopathy  Lungs: Respirations unlabored; clear to auscultation bilaterally without wheeze, rales, rhonchi  CVS exam: normal rate and regular rhythm.  Abdomen: Soft; nontender; nondistended; normoactive bowel sounds; no masses or hepatosplenomegaly  Musculoskeletal: No deformities; no active joint  inflammation  Extremities: No edema, cyanosis, clubbing  Vessels: Symmetric bilaterally  Neurologic: Alert and oriented; speech intact; face symmetrical; moves all extremities well; CNII-XII intact without focal deficit   Assessment:  1. PE (physical exam), annual   2. Essential hypertension   3. Lipid screening   4. Vitamin D deficiency   5. Ovarian failure     Plan:  Age appropriate preventive healthcare needs addressed; encouraged regular eye doctor and dental exams; encouraged regular exercise; will update labs and refills as needed today; follow-up to be  determined; DEXA updated today as well; follow-up in 1 year, sooner prn.    No follow-ups on file.  Orders Placed This Encounter  Procedures  . DG Bone Density    Standing Status:   Future    Standing Expiration Date:   08/13/2020    Order Specific Question:   Reason for Exam (SYMPTOM  OR DIAGNOSIS REQUIRED)    Answer:   ovarian failure    Order Specific Question:   Preferred imaging location?    Answer:   [facility]  . CBC w/Diff    Standing Status:   Future    Number of Occurrences:   1    Standing Expiration Date:   06/13/2020  . Comp Met (CMET)    Standing Status:   Future    Number of Occurrences:   1    Standing Expiration Date:   06/13/2020  . Lipid panel    Standing Status:   Future    Number of Occurrences:   1    Standing Expiration Date:   06/13/2020  . TSH    Standing Status:   Future    Number of Occurrences:   1    Standing Expiration Date:   06/13/2020  . Vitamin D (25 hydroxy)    Standing Status:   Future    Number of Occurrences:   1    Standing Expiration Date:   06/13/2020    Requested Prescriptions   Signed Prescriptions Disp Refills  . nebivolol (BYSTOLIC) 5 MG tablet 90 tablet 3    Sig: Take 1 tablet (5 mg total) by mouth daily.  Marland Kitchen ibuprofen (ADVIL) 800 MG tablet 90 tablet 0    Sig: Take 1 tablet (800 mg total) by mouth every 8 (eight) hours as needed.

## 2019-06-18 ENCOUNTER — Other Ambulatory Visit: Payer: Self-pay

## 2019-06-18 ENCOUNTER — Ambulatory Visit (INDEPENDENT_AMBULATORY_CARE_PROVIDER_SITE_OTHER)
Admission: RE | Admit: 2019-06-18 | Discharge: 2019-06-18 | Disposition: A | Discharge status: Home / Self Care | Payer: BC Managed Care – PPO | Source: Ambulatory Visit | Attending: Family | Admitting: Family

## 2019-06-18 DIAGNOSIS — E2839 Other primary ovarian failure: Secondary | ICD-10-CM

## 2019-06-23 DIAGNOSIS — E2839 Other primary ovarian failure: Secondary | ICD-10-CM | POA: Diagnosis not present

## 2019-07-05 ENCOUNTER — Other Ambulatory Visit: Payer: Self-pay | Admitting: Family

## 2019-08-05 ENCOUNTER — Telehealth: Payer: Self-pay | Admitting: Family

## 2019-08-05 NOTE — Telephone Encounter (Signed)
My chart message sent as well

## 2019-08-05 NOTE — Telephone Encounter (Signed)
I received refill request for Ibuprofen 800 mg. I wanted to clarify if she is actually taking  3 x per day everyday or did pharmacy send refill request. We gave her #[phone] at end of August.

## 2019-08-05 NOTE — Telephone Encounter (Signed)
Called and left message for patient to clarify Ibuprofen and how she was currently taking it.

## 2019-08-21 ENCOUNTER — Ambulatory Visit
Admission: RE | Admit: 2019-08-21 | Discharge: 2019-08-21 | Disposition: A | Discharge status: Home / Self Care | Payer: BC Managed Care – PPO | Source: Ambulatory Visit | Attending: General Surgery | Admitting: General Surgery

## 2019-08-21 ENCOUNTER — Other Ambulatory Visit: Payer: Self-pay

## 2019-08-21 ENCOUNTER — Ambulatory Visit
Admission: RE | Admit: 2019-08-21 | Discharge: 2019-08-21 | Disposition: A | Discharge status: Home / Self Care | Payer: BLUE CROSS/BLUE SHIELD | Source: Ambulatory Visit | Attending: General Surgery | Admitting: General Surgery

## 2019-08-21 DIAGNOSIS — N6310 Unspecified lump in the right breast, unspecified quadrant: Secondary | ICD-10-CM

## 2019-08-21 DIAGNOSIS — N6312 Unspecified lump in the right breast, upper inner quadrant: Secondary | ICD-10-CM | POA: Diagnosis not present

## 2019-08-21 DIAGNOSIS — N6314 Unspecified lump in the right breast, lower inner quadrant: Secondary | ICD-10-CM | POA: Diagnosis not present

## 2019-08-21 DIAGNOSIS — R922 Inconclusive mammogram: Secondary | ICD-10-CM | POA: Diagnosis not present

## 2019-12-06 DIAGNOSIS — D241 Benign neoplasm of right breast: Secondary | ICD-10-CM | POA: Diagnosis not present

## 2020-07-22 ENCOUNTER — Other Ambulatory Visit: Payer: Self-pay | Admitting: Family

## 2020-07-22 DIAGNOSIS — N6310 Unspecified lump in the right breast, unspecified quadrant: Secondary | ICD-10-CM

## 2020-08-03 ENCOUNTER — Encounter: Payer: Self-pay | Admitting: Family

## 2020-08-03 ENCOUNTER — Ambulatory Visit (INDEPENDENT_AMBULATORY_CARE_PROVIDER_SITE_OTHER): Payer: BC Managed Care – PPO | Admitting: Family

## 2020-08-03 ENCOUNTER — Other Ambulatory Visit: Payer: Self-pay

## 2020-08-03 VITALS — BP 132/78 | HR 92 | Temp 98.1°F | Ht 66.0 in | Wt 173.0 lb

## 2020-08-03 DIAGNOSIS — Z23 Encounter for immunization: Secondary | ICD-10-CM

## 2020-08-03 DIAGNOSIS — I10 Essential (primary) hypertension: Secondary | ICD-10-CM

## 2020-08-03 DIAGNOSIS — Z0000 Encounter for general adult medical examination without abnormal findings: Secondary | ICD-10-CM

## 2020-08-03 DIAGNOSIS — Z13220 Encounter for screening for lipoid disorders: Secondary | ICD-10-CM | POA: Diagnosis not present

## 2020-08-03 LAB — COMPREHENSIVE METABOLIC PANEL
AG Ratio: 1.8 (calc) (ref 1.0–2.5)
ALT: 12 U/L (ref 6–29)
AST: 14 U/L (ref 10–35)
Albumin: 4.3 g/dL (ref 3.6–5.1)
Alkaline phosphatase (APISO): 57 U/L (ref 37–153)
BUN: 17 mg/dL (ref 7–25)
CO2: 24 mmol/L (ref 20–32)
Calcium: 9.6 mg/dL (ref 8.6–10.4)
Chloride: 107 mmol/L (ref 98–110)
Creat: 0.93 mg/dL (ref 0.50–0.99)
Globulin: 2.4 g/dL (calc) (ref 1.9–3.7)
Glucose, Bld: 94 mg/dL (ref 65–99)
Potassium: 4.2 mmol/L (ref 3.5–5.3)
Sodium: 141 mmol/L (ref 135–146)
Total Bilirubin: 0.4 mg/dL (ref 0.2–1.2)
Total Protein: 6.7 g/dL (ref 6.1–8.1)

## 2020-08-03 LAB — CBC WITH DIFFERENTIAL/PLATELET
Absolute Monocytes: 512 cells/uL (ref 200–950)
Basophils Absolute: 61 cells/uL (ref 0–200)
Basophils Relative: 1 %
Eosinophils Absolute: 171 cells/uL (ref 15–500)
Eosinophils Relative: 2.8 %
HCT: 41.1 % (ref 35.0–45.0)
Hemoglobin: 14 g/dL (ref 11.7–15.5)
Lymphs Abs: 1464 cells/uL (ref 850–3900)
MCH: 31.8 pg (ref 27.0–33.0)
MCHC: 34.1 g/dL (ref 32.0–36.0)
MCV: 93.4 fL (ref 80.0–100.0)
MPV: 10.2 fL (ref 7.5–12.5)
Monocytes Relative: 8.4 %
Neutro Abs: 3892 cells/uL (ref 1500–7800)
Neutrophils Relative %: 63.8 %
Platelets: 279 10*3/uL (ref 140–400)
RBC: 4.4 10*6/uL (ref 3.80–5.10)
RDW: 12.3 % (ref 11.0–15.0)
Total Lymphocyte: 24 %
WBC: 6.1 10*3/uL (ref 3.8–10.8)

## 2020-08-03 LAB — LIPID PANEL
Cholesterol: 224 mg/dL — ABNORMAL HIGH (ref <200)
HDL: 61 mg/dL (ref >=50)
LDL Cholesterol (Calc): 129 mg/dL (calc) — ABNORMAL HIGH
Non-HDL Cholesterol (Calc): 163 mg/dL (calc) — ABNORMAL HIGH (ref <130)
Total CHOL/HDL Ratio: 3.7 (calc) (ref <5.0)
Triglycerides: 203 mg/dL — ABNORMAL HIGH (ref <150)

## 2020-08-03 LAB — TSH: TSH: 1.96 mIU/L (ref 0.40–4.50)

## 2020-08-03 MED ORDER — NEBIVOLOL HCL 5 MG PO TABS: 5.0000 mg | ORAL_TABLET | Freq: Every day | ORAL | 3 refills | Status: DC

## 2020-08-03 NOTE — Addendum Note (Signed)
Addended by: Cresenciano Lick on: 08/03/2020 09:51 AM   Modules accepted: Orders

## 2020-08-03 NOTE — Progress Notes (Signed)
°Melanie Douglas is a 64 y.o. female with the following history as recorded in Epic[facility]:  °Patient Active Problem List  ° Diagnosis Date Noted  °• Essential hypertension 09/18/2015  °• Left arm pain 08/31/2015  °• Lung nodule 08/31/2015  °  °Current Outpatient Medications  °Medication Sig Dispense Refill  °• ibuprofen (ADVIL) 800 MG tablet TAKE 1 TABLET BY MOUTH EVERY 8 HOURS AS NEEDED 90 tablet 0  °• nebivolol (BYSTOLIC) 5 MG tablet Take 1 tablet (5 mg total) by mouth daily. 90 tablet 3  ° °No current facility-administered medications for this visit.  °  °Allergies: Patient has no known allergies.  °Past Medical History:  °Diagnosis Date  °• Chicken pox   °• Hyperlipidemia   °• Hypertension   °• UTI (lower urinary tract infection)   °  °Past Surgical History:  °Procedure Laterality Date  °• COLONOSCOPY    °  °Family History  °Problem Relation Age of Onset  °• Hyperlipidemia Mother   °• Hypertension Mother   °• Healthy Father   °• Healthy Maternal Grandmother   °• Prostate cancer Maternal Grandfather   °• Healthy Paternal Grandmother   °• Healthy Paternal Grandfather   °• Colon cancer Maternal Uncle   °• Colon polyps Neg Hx   °• Esophageal cancer Neg Hx   °• Stomach cancer Neg Hx   °• Rectal cancer Neg Hx   °• Breast cancer Neg Hx   °  °Social History  ° °Tobacco Use  °• Smoking status: Former Smoker  °  Packs/day: 1.00  °  Years: 40.00  °  Pack years: 40.00  °  Types: Cigarettes  °  Quit date: 12/02/2014  °  Years since quitting: 5.6  °• Smokeless tobacco: Never Used  °Substance Use Topics  °• Alcohol use: Yes  °  Alcohol/week: 14.0 standard drinks  °  Types: 14 Glasses of wine per week  °  Comment: daily  °  °Subjective:  °Presents for yearly CPE today; in baseline state of health; needs refill on her Bystolic; sees eye doctor and dentist regularly;  °Defers flu shot today; ° ° ° ° °Objective:  °Vitals:  ° 08/03/20 0913  °BP: 132/78  °Pulse: 92  °Temp: 98.1 °F (36.7 °C)  °TempSrc: Oral  °SpO2: 97%  °Weight: 173 lb  (78.5 kg)  °Height: 5' 6" (1.676 m)  °  °General: Well developed, well nourished, in no acute distress  °Skin : Warm and dry.  °Head: Normocephalic and atraumatic  °Eyes: Sclera and conjunctiva clear; pupils round and reactive to light; extraocular movements intact  °Ears: External normal; canals clear; tympanic membranes normal  °Oropharynx: Pink, supple. No suspicious lesions  °Neck: Supple without thyromegaly, adenopathy  °Lungs: Respirations unlabored; clear to auscultation bilaterally without wheeze, rales, rhonchi  °CVS exam: normal rate and regular rhythm.  °Abdomen: Soft; nontender; nondistended; normoactive bowel sounds; no masses or hepatosplenomegaly  °Musculoskeletal: No deformities; no active joint inflammation  °Extremities: No edema, cyanosis, clubbing  °Vessels: Symmetric bilaterally  °Neurologic: Alert and oriented; speech intact; face symmetrical; moves all extremities well; CNII-XII intact without focal deficit  ° °Assessment:  °1. PE (physical exam), annual   °2. Essential hypertension   °3. Lipid screening   °  °Plan:  °Age appropriate preventive healthcare needs addressed; encouraged regular eye doctor and dental exams; encouraged regular exercise; will update labs and refills as needed today; follow-up to be determined; °Tdap updated today; plan for pap smear next year;  °Patient defers flu shot today; ° °  This visit occurred during the SARS-CoV-2 public health emergency.  Safety protocols were in place, including screening questions prior to the visit, additional usage of staff PPE, and extensive cleaning of exam room while observing appropriate contact time as indicated for disinfecting solutions.  ° ° ° °No follow-ups on file.  °Orders Placed This Encounter  °Procedures  °• CBC with Differential/Platelet  °  Standing Status:   Future  °  Standing Expiration Date:   08/03/2021  °• Comp Met (CMET)  °  Standing Status:   Future  °  Standing Expiration Date:   08/03/2021  °• Lipid panel  °   Standing Status:   Future  °  Standing Expiration Date:   08/03/2021  °• TSH  °  Standing Status:   Future  °  Standing Expiration Date:   08/03/2021  °  °Requested Prescriptions  ° °Signed Prescriptions Disp Refills  °• nebivolol (BYSTOLIC) 5 MG tablet 90 tablet 3  °  Sig: Take 1 tablet (5 mg total) by mouth daily.  °  ° °

## 2020-08-03 NOTE — Addendum Note (Signed)
Addended by: Marcina Millard on: 08/03/2020 09:53 AM   Modules accepted: Orders

## 2020-08-21 ENCOUNTER — Ambulatory Visit
Admission: RE | Admit: 2020-08-21 | Discharge: 2020-08-21 | Disposition: A | Discharge status: Home / Self Care | Payer: BC Managed Care – PPO | Source: Ambulatory Visit | Attending: Family | Admitting: Family

## 2020-08-21 ENCOUNTER — Other Ambulatory Visit: Payer: Self-pay | Admitting: Family

## 2020-08-21 ENCOUNTER — Other Ambulatory Visit: Payer: Self-pay

## 2020-08-21 DIAGNOSIS — N6310 Unspecified lump in the right breast, unspecified quadrant: Secondary | ICD-10-CM

## 2020-08-21 DIAGNOSIS — R922 Inconclusive mammogram: Secondary | ICD-10-CM | POA: Diagnosis not present

## 2020-08-21 DIAGNOSIS — N6312 Unspecified lump in the right breast, upper inner quadrant: Secondary | ICD-10-CM | POA: Diagnosis not present

## 2020-08-21 DIAGNOSIS — N6012 Diffuse cystic mastopathy of left breast: Secondary | ICD-10-CM | POA: Diagnosis not present

## 2020-08-21 DIAGNOSIS — N6314 Unspecified lump in the right breast, lower inner quadrant: Secondary | ICD-10-CM | POA: Diagnosis not present

## 2020-11-27 ENCOUNTER — Other Ambulatory Visit: Payer: Self-pay | Admitting: Family

## 2020-12-23 NOTE — Telephone Encounter (Signed)
Possible duplicate 

## 2020-12-25 MED ORDER — IBUPROFEN 800 MG PO TABS: 800.0000 mg | ORAL_TABLET | Freq: Three times a day (TID) | ORAL | 0 refills | Status: DC | PRN

## 2021-06-22 DIAGNOSIS — D485 Neoplasm of uncertain behavior of skin: Secondary | ICD-10-CM | POA: Diagnosis not present

## 2021-06-22 DIAGNOSIS — L820 Inflamed seborrheic keratosis: Secondary | ICD-10-CM | POA: Diagnosis not present

## 2021-06-22 DIAGNOSIS — C44519 Basal cell carcinoma of skin of other part of trunk: Secondary | ICD-10-CM | POA: Diagnosis not present

## 2021-07-19 ENCOUNTER — Other Ambulatory Visit: Payer: Self-pay | Admitting: Family

## 2021-07-19 DIAGNOSIS — N63 Unspecified lump in unspecified breast: Secondary | ICD-10-CM

## 2021-08-10 ENCOUNTER — Other Ambulatory Visit: Payer: Self-pay

## 2021-08-10 ENCOUNTER — Other Ambulatory Visit (HOSPITAL_COMMUNITY)
Admission: RE | Admit: 2021-08-10 | Discharge: 2021-08-10 | Disposition: A | Discharge status: Home / Self Care | Payer: BC Managed Care – PPO | Source: Ambulatory Visit | Attending: Family | Admitting: Family

## 2021-08-10 ENCOUNTER — Encounter: Payer: Self-pay | Admitting: Family

## 2021-08-10 ENCOUNTER — Ambulatory Visit (INDEPENDENT_AMBULATORY_CARE_PROVIDER_SITE_OTHER): Payer: BC Managed Care – PPO | Admitting: Family

## 2021-08-10 VITALS — BP 160/90 | HR 78 | Temp 98.0°F | Ht 66.0 in | Wt 174.0 lb

## 2021-08-10 DIAGNOSIS — Z0000 Encounter for general adult medical examination without abnormal findings: Secondary | ICD-10-CM | POA: Diagnosis not present

## 2021-08-10 DIAGNOSIS — Z124 Encounter for screening for malignant neoplasm of cervix: Secondary | ICD-10-CM | POA: Diagnosis not present

## 2021-08-10 DIAGNOSIS — I10 Essential (primary) hypertension: Secondary | ICD-10-CM

## 2021-08-10 DIAGNOSIS — Z86010 Personal history of colonic polyps: Secondary | ICD-10-CM

## 2021-08-10 DIAGNOSIS — Z1211 Encounter for screening for malignant neoplasm of colon: Secondary | ICD-10-CM

## 2021-08-10 DIAGNOSIS — Z13220 Encounter for screening for lipoid disorders: Secondary | ICD-10-CM | POA: Diagnosis not present

## 2021-08-10 NOTE — Patient Instructions (Signed)
Please start taking 2 of your Bystolic daily ( at the same time) and start checking your blood pressure. Please let me hear from you in 1-2 weeks with your response to dosage change.

## 2021-08-10 NOTE — Progress Notes (Signed)
Melanie Douglas is a 65 y.o. female with the following history as recorded in EpicCare:  Patient Active Problem List   Diagnosis Date Noted   Essential hypertension 09/18/2015   Left arm pain 08/31/2015   Lung nodule 08/31/2015    Current Outpatient Medications  Medication Sig Dispense Refill   ibuprofen (ADVIL) 800 MG tablet Take 1 tablet (800 mg total) by mouth every 8 (eight) hours as needed. 90 tablet 0   nebivolol (BYSTOLIC) 5 MG tablet Take 1 tablet (5 mg total) by mouth daily. 90 tablet 3   No current facility-administered medications for this visit.    Allergies: Patient has no known allergies.  Past Medical History:  Diagnosis Date   Chicken pox    Hyperlipidemia    Hypertension    UTI (lower urinary tract infection)     Past Surgical History:  Procedure Laterality Date   COLONOSCOPY      Family History  Problem Relation Age of Onset   Hyperlipidemia Mother    Hypertension Mother    Healthy Father    Healthy Maternal Grandmother    Prostate cancer Maternal Grandfather    Healthy Paternal Grandmother    Healthy Paternal Grandfather    Colon cancer Maternal Uncle    Colon polyps Neg Hx    Esophageal cancer Neg Hx    Stomach cancer Neg Hx    Rectal cancer Neg Hx    Breast cancer Neg Hx     Social History   Tobacco Use   Smoking status: Former    Packs/day: 1.00    Years: 40.00    Pack years: 40.00    Types: Cigarettes    Quit date: 12/02/2014    Years since quitting: 6.6   Smokeless tobacco: Never  Substance Use Topics   Alcohol use: Yes    Alcohol/week: 14.0 standard drinks    Types: 14 Glasses of wine per week    Comment: daily    Subjective:   Presents for yearly CPE; is agreeable to having pap smear updated; Is surprised to see how elevated her blood pressure is today- admits under high stress; Denies any chest pain, shortness of breath, blurred vision or headache Seeing eye doctor and dentist regularly; Defers flu shot today;   Review of  Systems  Constitutional: Negative.   HENT: Negative.    Eyes: Negative.   Respiratory: Negative.    Cardiovascular: Negative.   Gastrointestinal: Negative.   Genitourinary: Negative.   Musculoskeletal: Negative.   Skin: Negative.   Neurological: Negative.   Endo/Heme/Allergies: Negative.   Psychiatric/Behavioral: Negative.         Objective:  Vitals:   08/10/21 1312 08/10/21 1318  BP: (!) 150/100 (!) 160/90  Pulse: 78   Temp: 98 F (36.7 C)   TempSrc: Oral   SpO2: 95%   Weight: 174 lb (78.9 kg)   Height: '5\' 6"'  (1.676 m)     General: Well developed, well nourished, in no acute distress  Skin : Warm and dry.  Head: Normocephalic and atraumatic  Eyes: Sclera and conjunctiva clear; pupils round and reactive to light; extraocular movements intact  Ears: External normal; canals clear; tympanic membranes normal  Oropharynx: Pink, supple. No suspicious lesions  Neck: Supple without thyromegaly, adenopathy  Lungs: Respirations unlabored; clear to auscultation bilaterally without wheeze, rales, rhonchi  CVS exam: normal rate and regular rhythm.  Abdomen: Soft; nontender; nondistended; normoactive bowel sounds; no masses or hepatosplenomegaly  Musculoskeletal: No deformities; no active joint inflammation  Extremities: No  edema, cyanosis, clubbing  Vessels: Symmetric bilaterally  Neurologic: Alert and oriented; speech intact; face symmetrical; moves all extremities well; CNII-XII intact without focal deficit  Pelvic exam: normal external genitalia, vulva, vagina, cervix, uterus and adnexa.  Assessment:  1. PE (physical exam), annual   2. Encounter for colonoscopy due to history of adenomatous colonic polyps   3. Cervical cancer screening   4. Lipid screening   5. Essential hypertension     Plan:  Age appropriate preventive healthcare needs addressed; encouraged regular eye doctor and dental exams; encouraged regular exercise; will update labs and refills as needed today;  follow-up to be determined; Order for colonoscopy updated; Thin Prep pap collected; She will start taking 2 of her blood pressure tablets daily- follow up by MyChart in 1-2 weeks with response;  Will plan for DEXA in 2023;   This visit occurred during the SARS-CoV-2 public health emergency.  Safety protocols were in place, including screening questions prior to the visit, additional usage of staff PPE, and extensive cleaning of exam room while observing appropriate contact time as indicated for disinfecting solutions.    No follow-ups on file.  Orders Placed This Encounter  Procedures   CBC with Differential/Platelet   Comp Met (CMET)   Lipid panel   TSH   Ambulatory referral to Gastroenterology    Referral Priority:   Routine    Referral Type:   Consultation    Referral Reason:   Specialty Services Required    Referred to Provider:   Jackquline Denmark, MD    Number of Visits Requested:   1    Requested Prescriptions    No prescriptions requested or ordered in this encounter

## 2021-08-11 LAB — LIPID PANEL
Cholesterol: 236 mg/dL — ABNORMAL HIGH (ref 0–200)
HDL: 58.3 mg/dL (ref >39.00)
NonHDL: 177.5
Total CHOL/HDL Ratio: 4
Triglycerides: 310 mg/dL — ABNORMAL HIGH (ref 0.0–149.0)
VLDL: 62 mg/dL — ABNORMAL HIGH (ref 0.0–40.0)

## 2021-08-11 LAB — CBC WITH DIFFERENTIAL/PLATELET
Basophils Absolute: 0.1 10*3/uL (ref 0.0–0.1)
Basophils Relative: 1.1 % (ref 0.0–3.0)
Eosinophils Absolute: 0.1 10*3/uL (ref 0.0–0.7)
Eosinophils Relative: 1.9 % (ref 0.0–5.0)
HCT: 41.9 % (ref 36.0–46.0)
Hemoglobin: 14.1 g/dL (ref 12.0–15.0)
Lymphocytes Relative: 26.2 % (ref 12.0–46.0)
Lymphs Abs: 1.7 10*3/uL (ref 0.7–4.0)
MCHC: 33.6 g/dL (ref 30.0–36.0)
MCV: 94.4 fl (ref 78.0–100.0)
Monocytes Absolute: 0.6 10*3/uL (ref 0.1–1.0)
Monocytes Relative: 9.2 % (ref 3.0–12.0)
Neutro Abs: 4 10*3/uL (ref 1.4–7.7)
Neutrophils Relative %: 61.6 % (ref 43.0–77.0)
Platelets: 271 10*3/uL (ref 150.0–400.0)
RBC: 4.44 Mil/uL (ref 3.87–5.11)
RDW: 12.9 % (ref 11.5–15.5)
WBC: 6.6 10*3/uL (ref 4.0–10.5)

## 2021-08-11 LAB — COMPREHENSIVE METABOLIC PANEL
ALT: 14 U/L (ref 0–35)
AST: 11 U/L (ref 0–37)
Albumin: 4.4 g/dL (ref 3.5–5.2)
Alkaline Phosphatase: 55 U/L (ref 39–117)
BUN: 15 mg/dL (ref 6–23)
CO2: 27 mEq/L (ref 19–32)
Calcium: 10 mg/dL (ref 8.4–10.5)
Chloride: 103 mEq/L (ref 96–112)
Creatinine, Ser: 0.94 mg/dL (ref 0.40–1.20)
GFR: 64.22 mL/min (ref >60.00)
Glucose, Bld: 99 mg/dL (ref 70–99)
Potassium: 4.3 mEq/L (ref 3.5–5.1)
Sodium: 141 mEq/L (ref 135–145)
Total Bilirubin: 0.4 mg/dL (ref 0.2–1.2)
Total Protein: 7.1 g/dL (ref 6.0–8.3)

## 2021-08-11 LAB — CYTOLOGY - PAP
Comment: NEGATIVE
Diagnosis: NEGATIVE
High risk HPV: NEGATIVE

## 2021-08-11 LAB — TSH: TSH: 2.85 u[IU]/mL (ref 0.35–5.50)

## 2021-08-11 LAB — LDL CHOLESTEROL, DIRECT: Direct LDL: 156 mg/dL

## 2021-08-13 ENCOUNTER — Other Ambulatory Visit: Payer: Self-pay | Admitting: Family

## 2021-08-13 MED ORDER — ATORVASTATIN CALCIUM 20 MG PO TABS: 20.0000 mg | ORAL_TABLET | Freq: Every day | ORAL | 1 refills | Status: DC

## 2021-08-23 ENCOUNTER — Encounter: Payer: Self-pay | Admitting: Family

## 2021-08-24 ENCOUNTER — Other Ambulatory Visit: Payer: Self-pay

## 2021-08-24 ENCOUNTER — Other Ambulatory Visit: Payer: Self-pay | Admitting: Family

## 2021-08-24 ENCOUNTER — Ambulatory Visit
Admission: RE | Admit: 2021-08-24 | Discharge: 2021-08-24 | Disposition: A | Discharge status: Home / Self Care | Payer: BC Managed Care – PPO | Source: Ambulatory Visit | Attending: Family | Admitting: Family

## 2021-08-24 DIAGNOSIS — R922 Inconclusive mammogram: Secondary | ICD-10-CM | POA: Diagnosis not present

## 2021-08-24 DIAGNOSIS — N63 Unspecified lump in unspecified breast: Secondary | ICD-10-CM

## 2021-08-24 DIAGNOSIS — I10 Essential (primary) hypertension: Secondary | ICD-10-CM

## 2021-08-24 MED ORDER — NEBIVOLOL HCL 10 MG PO TABS: 10.0000 mg | ORAL_TABLET | Freq: Every day | ORAL | 3 refills | Status: DC

## 2021-08-31 DIAGNOSIS — C44519 Basal cell carcinoma of skin of other part of trunk: Secondary | ICD-10-CM | POA: Diagnosis not present

## 2021-09-08 ENCOUNTER — Other Ambulatory Visit: Payer: Self-pay | Admitting: Family

## 2021-09-08 DIAGNOSIS — I10 Essential (primary) hypertension: Secondary | ICD-10-CM

## 2021-10-25 ENCOUNTER — Encounter: Payer: Self-pay | Admitting: Family

## 2021-10-26 ENCOUNTER — Other Ambulatory Visit: Payer: Self-pay | Admitting: Family

## 2021-10-26 MED ORDER — AMLODIPINE BESYLATE 5 MG PO TABS: 5.0000 mg | ORAL_TABLET | Freq: Every day | ORAL | 0 refills | Status: DC

## 2021-11-16 ENCOUNTER — Other Ambulatory Visit: Payer: Self-pay | Admitting: Family

## 2021-12-10 ENCOUNTER — Encounter: Payer: Self-pay | Admitting: Family

## 2021-12-10 ENCOUNTER — Other Ambulatory Visit: Payer: Self-pay | Admitting: Family

## 2021-12-10 MED ORDER — IBUPROFEN 800 MG PO TABS: 800.0000 mg | ORAL_TABLET | Freq: Three times a day (TID) | ORAL | 0 refills | Status: DC | PRN

## 2021-12-14 ENCOUNTER — Emergency Department (HOSPITAL_COMMUNITY): Payer: BC Managed Care – PPO

## 2021-12-14 ENCOUNTER — Emergency Department (HOSPITAL_COMMUNITY)
Admission: EM | Admit: 2021-12-14 | Discharge: 2021-12-14 | Disposition: A | Discharge status: Home / Self Care | Payer: BC Managed Care – PPO | Attending: Emergency Medicine | Admitting: Emergency Medicine

## 2021-12-14 ENCOUNTER — Encounter (HOSPITAL_COMMUNITY): Payer: Self-pay

## 2021-12-14 ENCOUNTER — Other Ambulatory Visit: Payer: Self-pay

## 2021-12-14 DIAGNOSIS — Z79899 Other long term (current) drug therapy: Secondary | ICD-10-CM | POA: Insufficient documentation

## 2021-12-14 DIAGNOSIS — I10 Essential (primary) hypertension: Secondary | ICD-10-CM | POA: Diagnosis not present

## 2021-12-14 DIAGNOSIS — R000 Tachycardia, unspecified: Secondary | ICD-10-CM | POA: Diagnosis not present

## 2021-12-14 LAB — CBC
HCT: 41.8 % (ref 36.0–46.0)
Hemoglobin: 14.5 g/dL (ref 12.0–15.0)
MCH: 31.9 pg (ref 26.0–34.0)
MCHC: 34.7 g/dL (ref 30.0–36.0)
MCV: 91.9 fL (ref 80.0–100.0)
Platelets: 271 10*3/uL (ref 150–400)
RBC: 4.55 MIL/uL (ref 3.87–5.11)
RDW: 12.3 % (ref 11.5–15.5)
WBC: 8.1 10*3/uL (ref 4.0–10.5)
nRBC: 0 % (ref 0.0–0.2)

## 2021-12-14 LAB — BASIC METABOLIC PANEL
Anion gap: 12 (ref 5–15)
BUN: 23 mg/dL (ref 8–23)
CO2: 21 mmol/L — ABNORMAL LOW (ref 22–32)
Calcium: 9.8 mg/dL (ref 8.9–10.3)
Chloride: 106 mmol/L (ref 98–111)
Creatinine, Ser: 0.68 mg/dL (ref 0.44–1.00)
GFR, Estimated: >60 mL/min (ref >60)
Glucose, Bld: 131 mg/dL — ABNORMAL HIGH (ref 70–99)
Potassium: 3.5 mmol/L (ref 3.5–5.1)
Sodium: 139 mmol/L (ref 135–145)

## 2021-12-14 LAB — TSH: TSH: 3.512 u[IU]/mL (ref 0.350–4.500)

## 2021-12-14 LAB — D-DIMER, QUANTITATIVE: D-Dimer, Quant: <0.27 ug/mL-FEU (ref 0.00–0.50)

## 2021-12-14 MED ORDER — METOPROLOL TARTRATE 5 MG/5ML IV SOLN
5.0000 mg | Freq: Once | INTRAVENOUS | Status: AC
  Administered 2021-12-14: 5 mg via INTRAVENOUS
  Filled 2021-12-14: qty 5

## 2021-12-14 MED ORDER — METOPROLOL TARTRATE 25 MG PO TABS: 25.0000 mg | ORAL_TABLET | Freq: Two times a day (BID) | ORAL | 0 refills | Status: DC

## 2021-12-14 NOTE — ED Provider Notes (Signed)
[facility] DEPT Provider Note   CSN: [id number] Arrival date & time: 12/14/21  1557     History  Chief Complaint  Patient presents with   Tachycardia    Melanie Douglas is a 66 y.o. female.  HPI  Patient presents to the ED for evaluation of tachycardia.  Patient states she had been on Bystolic for her blood pressure for a while.  Her insurance stopped covering it and so she had to change regimens.  Patient spoke to her doctor in December.  She had about a month left of her prescription so she was going to taper off and switch to amlodipine.  Patient has noticed that her heart rate has been high since stopping her Bystolic medication.  Patient states she was at work a few days ago when she started to feel lightheaded and dizzy.  She noted that her heart was racing.  She was not having any trouble with any chest pain or shortness of breath.  Patient called her doctor and was told to come to the ED.  Prior records reviewed and the patient did call her doctor on February 3 about this episode.  Home Medications Prior to Admission medications   Medication Sig Start Date End Date Taking? Authorizing Provider  amLODipine (NORVASC) 5 MG tablet Take 1 tablet (5 mg total) by mouth daily. Patient taking differently: Take 10 mg by mouth daily. 10/26/21  Yes Marrian Salvage, FNP  atorvastatin (LIPITOR) 20 MG tablet Take 1 tablet (20 mg total) by mouth daily. 08/13/21  Yes Marrian Salvage, FNP  cholecalciferol (VITAMIN D3) 25 MCG (1000 UNIT) tablet Take 1,000 Units by mouth daily.   Yes [provider]  ibuprofen (ADVIL) 800 MG tablet Take 1 tablet (800 mg total) by mouth every 8 (eight) hours as needed. Patient taking differently: Take 800 mg by mouth every 8 (eight) hours as needed for moderate pain. 12/10/21  Yes Marrian Salvage, FNP  metoprolol tartrate (LOPRESSOR) 25 MG tablet Take 1 tablet (25 mg total) by mouth 2 (two) times daily. 12/14/21   Yes Dorie Rank, MD      Allergies    Patient has no known allergies.    Review of Systems   Review of Systems  Constitutional:  Negative for fever.  Respiratory:  Negative for shortness of breath.   Cardiovascular:  Negative for chest pain.  All other systems reviewed and are negative.  Physical Exam Updated Vital Signs BP (!) 147/92 (BP Location: Right Arm)    Pulse (!) 120    Temp 98.7 F (37.1 C)    Resp 20    Ht 1.676 m (5\' 6" )    Wt 77.1 kg    SpO2 99%    BMI 27.44 kg/m  Physical Exam Vitals and nursing note reviewed.  Constitutional:      General: She is not in acute distress.    Appearance: She is well-developed.  HENT:     Head: Normocephalic and atraumatic.     Right Ear: External ear normal.     Left Ear: External ear normal.  Eyes:     General: No scleral icterus.       Right eye: No discharge.        Left eye: No discharge.     Conjunctiva/sclera: Conjunctivae normal.  Neck:     Trachea: No tracheal deviation.  Cardiovascular:     Rate and Rhythm: Regular rhythm. Tachycardia present.  Pulmonary:     Effort:  Pulmonary effort is normal. No respiratory distress.     Breath sounds: Normal breath sounds. No stridor. No wheezing or rales.  Abdominal:     General: Bowel sounds are normal. There is no distension.     Palpations: Abdomen is soft.     Tenderness: There is no abdominal tenderness. There is no guarding or rebound.  Musculoskeletal:        General: No tenderness or deformity.     Cervical back: Neck supple.  Skin:    General: Skin is warm and dry.     Findings: No rash.  Neurological:     General: No focal deficit present.     Mental Status: She is alert.     Cranial Nerves: No cranial nerve deficit (no facial droop, extraocular movements intact, no slurred speech).     Sensory: No sensory deficit.     Motor: No abnormal muscle tone or seizure activity.     Coordination: Coordination normal.  Psychiatric:        Mood and Affect: Mood normal.     ED Results / Procedures / Treatments   Labs (all labs ordered are listed, but only abnormal results are displayed) Labs Reviewed  BASIC METABOLIC PANEL - Abnormal; Notable for the following components:      Result Value   CO2 21 (*)    Glucose, Bld 131 (*)    All other components within normal limits  CBC  TSH  D-DIMER, QUANTITATIVE    EKG EKG Interpretation  Date/Time:  Tuesday December 14 2021 16:21:44 EST Ventricular Rate:  117 PR Interval:  163 QRS Duration: 87 QT Interval:  312 QTC Calculation: 436 R Axis:   73 Text Interpretation: Sinus tachycardia Borderline repolarization abnormality No significant change since last tracing Confirmed by Dorie Rank [phone]) on 12/14/2021 4:49:07 PM  Radiology DG Chest 2 View  Result Date: 12/14/2021 CLINICAL DATA:  Tachycardia, hypertension EXAM: CHEST - 2 VIEW COMPARISON:  08/24/2015 FINDINGS: Frontal and lateral views of the chest demonstrate an unremarkable cardiac silhouette. No airspace disease, effusion, or pneumothorax. No acute bony abnormality. IMPRESSION: 1. No acute intrathoracic process. Electronically Signed   By: Randa Ngo M.D.   On: 12/14/2021 17:00    Procedures .1-3 Lead EKG Interpretation Performed by: Dorie Rank, MD Authorized by: Dorie Rank, MD     Interpretation: normal     ECG rate:  105   ECG rate assessment: normal     Rhythm: sinus rhythm     Ectopy: none     Conduction: normal      Medications Ordered in ED Medications  metoprolol tartrate (LOPRESSOR) injection 5 mg (has no administration in time range)    ED Course/ Medical Decision Making/ A&P Clinical Course as of 12/14/21 1808  Tue Dec 14, 2021  1800 D-dimer, quantitative D-dimer normal [JK]  1801 CBC CBC normal [JK]  8676 Basic metabolic panel(!) Metabolic panel normal [JK]    Clinical Course User Index [JK] Dorie Rank, MD                           Medical Decision Making Amount and/or Complexity of Data Reviewed Labs:  ordered. Decision-making details documented in ED Course. Radiology: ordered.  Risk Prescription drug management.  Tachycardia Patient peers to have sinus tachycardia on her EKG.  No signs of dysrhythmia.  Suspect she is having issues after coming off of her beta-blockers.  No complaints of chest pain or shortness of  breath but with her tachycardia will screen for pulmonary embolism and also check a TSH.  We will also assess for anemia and electrolyte abnormalities  Labs reviewed.  D-dimer negative doubt PE.  CBC is normal.  No significant electrolyte abnormalities.  Patient's heart rate has improved in the ED.  Suspect her tachycardia is related to her angina medications.  She was on a beta-blocker and now is just on a calcium channel blocker.  I have added a thyroid panel and that is still pending.  It is possible she might be having some issues with hyperthyroidism.  Patient otherwise is feeling well.  Will give a dose of metoprolol and discharge home with addition of low-dose beta-blocker.  Follow-up with her primary care doctor discuss her medications.       Final Clinical Impression(s) / ED Diagnoses Final diagnoses:  Sinus tachycardia    Rx / DC Orders ED Discharge Orders          Ordered    metoprolol tartrate (LOPRESSOR) 25 MG tablet  2 times daily        12/14/21 1807              Dorie Rank, MD 12/14/21 [phone]

## 2021-12-14 NOTE — Telephone Encounter (Signed)
I have called pt and and relayed the provider message and she stated that she will call her husband to take her to [facility] since she is across the street.

## 2021-12-14 NOTE — Discharge Instructions (Addendum)
Start taking the metoprolol.   Your thyroid test was also still pending at the time of the discharge.  Follow-up with your primary care doctor to be rechecked and to review your blood pressure medications.

## 2021-12-14 NOTE — ED Triage Notes (Signed)
"  Changed blood pressure medications two weeks ago, the medication is not helping my blood pressure and it is making my heart rate go up" per pt

## 2021-12-16 ENCOUNTER — Encounter: Payer: Self-pay | Admitting: Family

## 2021-12-17 ENCOUNTER — Other Ambulatory Visit: Payer: Self-pay | Admitting: Family

## 2021-12-17 DIAGNOSIS — I10 Essential (primary) hypertension: Secondary | ICD-10-CM

## 2021-12-17 MED ORDER — NEBIVOLOL HCL 10 MG PO TABS: 10.0000 mg | ORAL_TABLET | Freq: Every day | ORAL | 3 refills | Status: DC

## 2022-01-04 ENCOUNTER — Encounter: Payer: Self-pay | Admitting: Family

## 2022-01-24 ENCOUNTER — Other Ambulatory Visit: Payer: Self-pay | Admitting: Family

## 2022-02-07 ENCOUNTER — Other Ambulatory Visit: Payer: Self-pay | Admitting: Family

## 2022-02-08 ENCOUNTER — Ambulatory Visit: Payer: BC Managed Care – PPO | Admitting: Family

## 2022-02-08 VITALS — BP 122/82 | HR 69 | Temp 98.0°F | Resp 18 | Ht 66.0 in | Wt 178.4 lb

## 2022-02-08 DIAGNOSIS — I10 Essential (primary) hypertension: Secondary | ICD-10-CM

## 2022-02-08 DIAGNOSIS — Z86010 Personal history of colonic polyps: Secondary | ICD-10-CM | POA: Diagnosis not present

## 2022-02-08 DIAGNOSIS — E785 Hyperlipidemia, unspecified: Secondary | ICD-10-CM | POA: Diagnosis not present

## 2022-02-08 DIAGNOSIS — Z1211 Encounter for screening for malignant neoplasm of colon: Secondary | ICD-10-CM

## 2022-02-08 LAB — CBC WITH DIFFERENTIAL/PLATELET
Basophils Absolute: 0.1 10*3/uL (ref 0.0–0.1)
Basophils Relative: 0.8 % (ref 0.0–3.0)
Eosinophils Absolute: 0.1 10*3/uL (ref 0.0–0.7)
Eosinophils Relative: 2 % (ref 0.0–5.0)
HCT: 40.7 % (ref 36.0–46.0)
Hemoglobin: 13.6 g/dL (ref 12.0–15.0)
Lymphocytes Relative: 25.4 % (ref 12.0–46.0)
Lymphs Abs: 1.7 10*3/uL (ref 0.7–4.0)
MCHC: 33.3 g/dL (ref 30.0–36.0)
MCV: 94.6 fl (ref 78.0–100.0)
Monocytes Absolute: 0.6 10*3/uL (ref 0.1–1.0)
Monocytes Relative: 8.6 % (ref 3.0–12.0)
Neutro Abs: 4.2 10*3/uL (ref 1.4–7.7)
Neutrophils Relative %: 63.2 % (ref 43.0–77.0)
Platelets: 253 10*3/uL (ref 150.0–400.0)
RBC: 4.3 Mil/uL (ref 3.87–5.11)
RDW: 12.8 % (ref 11.5–15.5)
WBC: 6.6 10*3/uL (ref 4.0–10.5)

## 2022-02-08 LAB — COMPREHENSIVE METABOLIC PANEL
ALT: 13 U/L (ref 0–35)
AST: 13 U/L (ref 0–37)
Albumin: 4.5 g/dL (ref 3.5–5.2)
Alkaline Phosphatase: 60 U/L (ref 39–117)
BUN: 20 mg/dL (ref 6–23)
CO2: 27 mEq/L (ref 19–32)
Calcium: 9.8 mg/dL (ref 8.4–10.5)
Chloride: 104 mEq/L (ref 96–112)
Creatinine, Ser: 0.97 mg/dL (ref 0.40–1.20)
GFR: 61.62 mL/min (ref >60.00)
Glucose, Bld: 112 mg/dL — ABNORMAL HIGH (ref 70–99)
Potassium: 4.8 mEq/L (ref 3.5–5.1)
Sodium: 140 mEq/L (ref 135–145)
Total Bilirubin: 0.6 mg/dL (ref 0.2–1.2)
Total Protein: 7 g/dL (ref 6.0–8.3)

## 2022-02-08 LAB — LIPID PANEL
Cholesterol: 180 mg/dL (ref 0–200)
HDL: 59.7 mg/dL (ref >39.00)
LDL Cholesterol: 82 mg/dL (ref 0–99)
NonHDL: 120.36
Total CHOL/HDL Ratio: 3
Triglycerides: 194 mg/dL — ABNORMAL HIGH (ref 0.0–149.0)
VLDL: 38.8 mg/dL (ref 0.0–40.0)

## 2022-02-08 NOTE — Progress Notes (Signed)
?Melanie Douglas is a 66 y.o. female with the following history as recorded in EpicCare:  ?Patient Active Problem List  ? Diagnosis Date Noted  ? Essential hypertension 09/18/2015  ? Left arm pain 08/31/2015  ? Lung nodule 08/31/2015  ?  ?Current Outpatient Medications  ?Medication Sig Dispense Refill  ? atorvastatin (LIPITOR) 20 MG tablet TAKE 1 TABLET BY MOUTH EVERY DAY 90 tablet 1  ? cholecalciferol (VITAMIN D3) 25 MCG (1000 UNIT) tablet Take 1,000 Units by mouth daily.    ? ibuprofen (ADVIL) 800 MG tablet Take 1 tablet (800 mg total) by mouth every 8 (eight) hours as needed. (Patient taking differently: Take 800 mg by mouth every 8 (eight) hours as needed for moderate pain.) 90 tablet 0  ? nebivolol (BYSTOLIC) 10 MG tablet Take 1 tablet (10 mg total) by mouth daily. 90 tablet 3  ? ?No current facility-administered medications for this visit.  ?  ?Allergies: Patient has no known allergies.  ?Past Medical History:  ?Diagnosis Date  ? Chicken pox   ? Hyperlipidemia   ? Hypertension   ? UTI (lower urinary tract infection)   ?  ?Past Surgical History:  ?Procedure Laterality Date  ? COLONOSCOPY    ?  ?Family History  ?Problem Relation Age of Onset  ? Hyperlipidemia Mother   ? Hypertension Mother   ? Healthy Father   ? Healthy Maternal Grandmother   ? Prostate cancer Maternal Grandfather   ? Healthy Paternal Grandmother   ? Healthy Paternal Grandfather   ? Colon cancer Maternal Uncle   ? Colon polyps Neg Hx   ? Esophageal cancer Neg Hx   ? Stomach cancer Neg Hx   ? Rectal cancer Neg Hx   ? Breast cancer Neg Hx   ?  ?Social History  ? ?Tobacco Use  ? Smoking status: Former  ?  Packs/day: 1.00  ?  Years: 40.00  ?  Pack years: 40.00  ?  Types: Cigarettes  ?  Quit date: 12/02/2014  ?  Years since quitting: 7.1  ? Smokeless tobacco: Never  ?Substance Use Topics  ? Alcohol use: Yes  ?  Alcohol/week: 14.0 standard drinks  ?  Types: 14 Glasses of wine per week  ?  Comment: daily  ?  ?Subjective:  ?6 month follow up on  hypertension/ hyperlipidemia;  ?Doing well on current blood pressure medication; needs labs to follow up on recent start of Lipitor;  ?Denies any chest pain, shortness of breath, blurred vision or headache ?Overdue for follow up colonoscopy;  ? ? ? ?Objective:  ?Vitals:  ? 02/08/22 0819  ?BP: 122/82  ?Pulse: 69  ?Resp: 18  ?Temp: 98 ?F (36.7 ?C)  ?TempSrc: Oral  ?SpO2: 95%  ?Weight: 178 lb 6.4 oz (80.9 kg)  ?Height: '5\' 6"'  (1.676 m)  ?  ?General: Well developed, well nourished, in no acute distress  ?Skin : Warm and dry.  ?Head: Normocephalic and atraumatic  ?Eyes: Sclera and conjunctiva clear; pupils round and reactive to light; extraocular movements intact  ?Ears: External normal; canals clear; tympanic membranes normal  ?Oropharynx: Pink, supple. No suspicious lesions  ?Neck: Supple without thyromegaly, adenopathy  ?Lungs: Respirations unlabored; clear to auscultation bilaterally without wheeze, rales, rhonchi  ?CVS exam: normal rate and regular rhythm.  ?Neurologic: Alert and oriented; speech intact; face symmetrical; moves all extremities well; CNII-XII intact without focal deficit  ? ?Assessment:  ?1. Primary hypertension   ?2. Encounter for colonoscopy due to history of adenomatous colonic polyps   ?  3. Hyperlipidemia, unspecified hyperlipidemia type   ?  ?Plan:  ?Stable; check CBC, CMP; continue same medication; note made that patient only needs to be on Bystolic; ?Order updated; ?Stable; update labs; ? ?This visit occurred during the SARS-CoV-2 public health emergency.  Safety protocols were in place, including screening questions prior to the visit, additional usage of staff PPE, and extensive cleaning of exam room while observing appropriate contact time as indicated for disinfecting solutions.  ? ? ?Return in about 1 year (around 02/09/2023).  ?Orders Placed This Encounter  ?Procedures  ? CBC with Differential/Platelet  ? Comp Met (CMET)  ? Lipid panel  ? Ambulatory referral to Gastroenterology  ?  Referral  Priority:   Routine  ?  Referral Type:   Consultation  ?  Referral Reason:   Specialty Services Required  ?  Referred to Provider:   Jackquline Denmark, MD  ?  Number of Visits Requested:   1  ?  ?Requested Prescriptions  ? ? No prescriptions requested or ordered in this encounter  ?  ? ?

## 2022-02-09 ENCOUNTER — Encounter: Payer: Self-pay | Admitting: Gastroenterology

## 2022-02-11 ENCOUNTER — Ambulatory Visit: Payer: BC Managed Care – PPO | Admitting: Family

## 2022-03-18 ENCOUNTER — Ambulatory Visit (AMBULATORY_SURGERY_CENTER): Payer: BC Managed Care – PPO | Admitting: *Deleted

## 2022-03-18 VITALS — Ht 66.0 in | Wt 178.0 lb

## 2022-03-18 DIAGNOSIS — Z86010 Personal history of colonic polyps: Secondary | ICD-10-CM

## 2022-03-18 MED ORDER — NA SULFATE-K SULFATE-MG SULF 17.5-3.13-1.6 GM/177ML PO SOLN: 1.0000 | ORAL | 0 refills | Status: DC

## 2022-03-18 NOTE — Progress Notes (Signed)
Patient's pre-visit was done today over the phone with the patient. Name,DOB and address verified. Patient denies any allergies to Eggs and Soy. Patient denies any problems with anesthesia/sedation. Patient is not taking any diet pills or blood thinners. No home Oxygen. Insurance confirmed with patient. ? ?Prep instructions sent to pt's MyChart-pt is aware. Patient understands to call us back with any questions or concerns. Patient is aware of our care-partner policy.  ? ?EMMI education assigned to the patient for the procedure, sent to MyChart.  ? ?The patient is COVID-19 vaccinated.   ?

## 2022-04-12 ENCOUNTER — Encounter: Payer: Self-pay | Admitting: Gastroenterology

## 2022-04-18 ENCOUNTER — Encounter: Payer: Self-pay | Admitting: Gastroenterology

## 2022-04-18 ENCOUNTER — Ambulatory Visit (AMBULATORY_SURGERY_CENTER): Payer: BC Managed Care – PPO | Admitting: Gastroenterology

## 2022-04-18 VITALS — BP 127/76 | HR 69 | Temp 96.4°F | Resp 15 | Ht 66.0 in | Wt 178.0 lb

## 2022-04-18 DIAGNOSIS — Z86010 Personal history of colonic polyps: Secondary | ICD-10-CM

## 2022-04-18 DIAGNOSIS — D122 Benign neoplasm of ascending colon: Secondary | ICD-10-CM | POA: Diagnosis not present

## 2022-04-18 DIAGNOSIS — Z09 Encounter for follow-up examination after completed treatment for conditions other than malignant neoplasm: Secondary | ICD-10-CM

## 2022-04-18 DIAGNOSIS — Z1211 Encounter for screening for malignant neoplasm of colon: Secondary | ICD-10-CM | POA: Diagnosis not present

## 2022-04-18 MED ORDER — SODIUM CHLORIDE 0.9 % IV SOLN: 500.0000 mL | Freq: Once | INTRAVENOUS | Status: DC

## 2022-04-18 NOTE — Patient Instructions (Signed)
Thank you for letting us take care of your healthcare needs today. Please see handouts given to you on Polyps, Diverticulosis and Hemorrhoids.   YOU HAD AN ENDOSCOPIC PROCEDURE TODAY AT [facility]:   Refer to the procedure report that was given to you for any specific questions about what was found during the examination.  If the procedure report does not answer your questions, please call your gastroenterologist to clarify.  If you requested that your care partner not be given the details of your procedure findings, then the procedure report has been included in a sealed envelope for you to review at your convenience later.  YOU SHOULD EXPECT: Some feelings of bloating in the abdomen. Passage of more gas than usual.  Walking can help get rid of the air that was put into your GI tract during the procedure and reduce the bloating. If you had a lower endoscopy (such as a colonoscopy or flexible sigmoidoscopy) you may notice spotting of blood in your stool or on the toilet paper. If you underwent a bowel prep for your procedure, you may not have a normal bowel movement for a few days.  Please Note:  You might notice some irritation and congestion in your nose or some drainage.  This is from the oxygen used during your procedure.  There is no need for concern and it should clear up in a day or so.  SYMPTOMS TO REPORT IMMEDIATELY:  Following lower endoscopy (colonoscopy or flexible sigmoidoscopy):  Excessive amounts of blood in the stool  Significant tenderness or worsening of abdominal pains  Swelling of the abdomen that is new, acute  Fever of 100F or higher   For urgent or emergent issues, a gastroenterologist can be reached at any hour by calling [phone]. Do not use MyChart messaging for urgent concerns.    DIET:  We do recommend a small meal at first, but then you may proceed to your regular diet.  Drink plenty of fluids but you should avoid alcoholic beverages for 24  hours.  ACTIVITY:  You should plan to take it easy for the rest of today and you should NOT DRIVE or use heavy machinery until tomorrow (because of the sedation medicines used during the test).    FOLLOW UP: Our staff will call the number listed on your records 24-72 hours following your procedure to check on you and address any questions or concerns that you may have regarding the information given to you following your procedure. If we do not reach you, we will leave a message.  We will attempt to reach you two times.  During this call, we will ask if you have developed any symptoms of COVID 19. If you develop any symptoms (ie: fever, flu-like symptoms, shortness of breath, cough etc.) before then, please call [phone].  If you test positive for Covid 19 in the 2 weeks post procedure, please call and report this information to [place].    If any biopsies were taken you will be contacted by phone or by letter within the next 1-3 weeks.  Please call us at [phone] if you have not heard about the biopsies in 3 weeks.    SIGNATURES/CONFIDENTIALITY: You and/or your care partner have signed paperwork which will be entered into your electronic medical record.  These signatures attest to the fact that that the information above on your After Visit Summary has been reviewed and is understood.  Full responsibility of the confidentiality of this discharge information  lies with you and/or your care-partner.  

## 2022-04-18 NOTE — Progress Notes (Signed)
[facility] History and Physical   Primary Care Physician:  Marrian Salvage, [place]   Reason for Procedure:   History of polyps 2019  Plan:     colonoscopy     HPI: Melanie Douglas is a 66 y.o. female    Past Medical History:  Diagnosis Date   Chicken pox    Hyperlipidemia    Hypertension    UTI (lower urinary tract infection)     Past Surgical History:  Procedure Laterality Date   COLONOSCOPY  07/16/2018   Dr.Daine Croker    Prior to Admission medications   Medication Sig Start Date End Date Taking? Authorizing Provider  atorvastatin (LIPITOR) 20 MG tablet TAKE 1 TABLET BY MOUTH EVERY DAY 02/07/22  Yes Marrian Salvage, FNP  cholecalciferol (VITAMIN D3) 25 MCG (1000 UNIT) tablet Take 1,000 Units by mouth daily.   Yes [provider]  ibuprofen (ADVIL) 800 MG tablet Take 1 tablet (800 mg total) by mouth every 8 (eight) hours as needed. Patient taking differently: Take 800 mg by mouth every 8 (eight) hours as needed for moderate pain. 12/10/21  Yes Marrian Salvage, FNP  nebivolol (BYSTOLIC) 10 MG tablet Take 1 tablet (10 mg total) by mouth daily. 12/17/21  Yes Marrian Salvage, FNP  Specialty Vitamins Products (BRAIN PO) Take by mouth.   Yes [provider]    Current Outpatient Medications  Medication Sig Dispense Refill   atorvastatin (LIPITOR) 20 MG tablet TAKE 1 TABLET BY MOUTH EVERY DAY 90 tablet 1   cholecalciferol (VITAMIN D3) 25 MCG (1000 UNIT) tablet Take 1,000 Units by mouth daily.     ibuprofen (ADVIL) 800 MG tablet Take 1 tablet (800 mg total) by mouth every 8 (eight) hours as needed. (Patient taking differently: Take 800 mg by mouth every 8 (eight) hours as needed for moderate pain.) 90 tablet 0   nebivolol (BYSTOLIC) 10 MG tablet Take 1 tablet (10 mg total) by mouth daily. 90 tablet 3   Specialty Vitamins Products (BRAIN PO) Take by mouth.     Current Facility-Administered Medications  Medication Dose Route  Frequency Provider Last Rate Last Admin   0.9 %  sodium chloride infusion  500 mL Intravenous Once Jackquline Denmark, MD        Allergies as of 04/18/2022   (No Known Allergies)    Family History  Problem Relation Age of Onset   Hyperlipidemia Mother    Hypertension Mother    Healthy Father    Healthy Maternal Grandmother    Prostate cancer Maternal Grandfather    Healthy Paternal Grandmother    Healthy Paternal Grandfather    Colon cancer Maternal Uncle    Colon polyps Neg Hx    Esophageal cancer Neg Hx    Stomach cancer Neg Hx    Rectal cancer Neg Hx    Breast cancer Neg Hx     Social History   Socioeconomic History   Marital status: Married    Spouse name: Not on file   Number of children: 3   Years of education: 16   Highest education level: Not on file  Occupational History   Occupation: Controller  Tobacco Use   Smoking status: Former    Packs/day: 1.00    Years: 40.00    Total pack years: 40.00    Types: Cigarettes    Quit date: 12/02/2014    Years since quitting: 7.3   Smokeless tobacco: Never  Vaping Use   Vaping Use: Former  Substance  and Sexual Activity   Alcohol use: Yes    Alcohol/week: 14.0 standard drinks of alcohol    Types: 14 Glasses of wine per week    Comment: daily   Drug use: No   Sexual activity: Not on file  Other Topics Concern   Not on file  Social History Narrative   Fun: Garden   Denies religious beliefs effecting healthcare.   Denies abuse and feels safe at home.    Social Determinants of Health   Financial Resource Strain: Not on file  Food Insecurity: Not on file  Transportation Needs: Not on file  Physical Activity: Not on file  Stress: Not on file  Social Connections: Not on file  Intimate Partner Violence: Not on file    Review of Systems: Positive for none All other review of systems negative except as mentioned in the HPI.  Physical Exam: Vital signs in last 24 hours: '@VSRANGES'$ @   General:   Alert,   Well-developed, well-nourished, pleasant and cooperative in NAD Lungs:  Clear throughout to auscultation.   Heart:  Regular rate and rhythm; no murmurs, clicks, rubs,  or gallops. Abdomen:  Soft, nontender and nondistended. Normal bowel sounds.   Neuro/Psych:  Alert and cooperative. Normal mood and affect. A and O x 3    No significant changes were identified.  The patient continues to be an appropriate candidate for the planned procedure and anesthesia.   Carmell Austria, MD. [facility] 04/18/2022 8:42 AM@

## 2022-04-18 NOTE — Progress Notes (Signed)
Pt non-responsive, VVS, Report to RN  °

## 2022-04-18 NOTE — Progress Notes (Signed)
Called to room to assist during endoscopic procedure.  Patient ID and intended procedure confirmed with present staff. Received instructions for my participation in the procedure from the performing physician.  

## 2022-04-18 NOTE — Op Note (Signed)
[place] Patient Name: Melanie Douglas Procedure Date: 04/18/2022 8:46 AM MRN: [id number] Endoscopist: Jackquline Denmark , MD Age: 66 Referring MD:  Date of Birth: 1956-04-29 Gender: Female Account #: [id number] Procedure:                Colonoscopy Indications:              High risk colon cancer surveillance: Personal                            history of colonic polyps Medicines:                Monitored Anesthesia Care Procedure:                Pre-Anesthesia Assessment:                           - Prior to the procedure, a History and Physical                            was performed, and patient medications and                            allergies were reviewed. The patient's tolerance of                            previous anesthesia was also reviewed. The risks                            and benefits of the procedure and the sedation                            options and risks were discussed with the patient.                            All questions were answered, and informed consent                            was obtained. Prior Anticoagulants: The patient has                            taken no previous anticoagulant or antiplatelet                            agents. ASA Grade Assessment: II - A patient with                            mild systemic disease. After reviewing the risks                            and benefits, the patient was deemed in                            satisfactory condition to undergo the procedure.  After obtaining informed consent, the colonoscope                            was passed under direct vision. Throughout the                            procedure, the patient's blood pressure, pulse, and                            oxygen saturations were monitored continuously. The                            PCF-HQ190L Colonoscope was introduced through the                            anus and advanced to the the cecum,  identified by                            appendiceal orifice and ileocecal valve. The                            colonoscopy was performed without difficulty. The                            patient tolerated the procedure well. The quality                            of the bowel preparation was good. The ileocecal                            valve, appendiceal orifice, and rectum were                            photographed. Scope In: 8:50:39 AM Scope Out: 9:03:42 AM Scope Withdrawal Time: 0 hours 9 minutes 16 seconds  Total Procedure Duration: 0 hours 13 minutes 3 seconds  Findings:                 A 2 mm polyp was found in the mid ascending colon.                            The polyp was sessile. The polyp was removed with a                            cold biopsy forceps. Resection and retrieval were                            complete.                           Multiple small-mouthed diverticula were found in                            the sigmoid colon.  Non-bleeding internal hemorrhoids were found during                            retroflexion. The hemorrhoids were small and Grade                            I (internal hemorrhoids that do not prolapse).                           The exam was otherwise without abnormality on                            direct and retroflexion views. Complications:            No immediate complications. Estimated Blood Loss:     Estimated blood loss: none. Impression:               - One 2 mm polyp in the mid ascending colon,                            removed with a cold biopsy forceps. Resected and                            retrieved.                           - Diverticulosis in the sigmoid colon.                           - Non-bleeding internal hemorrhoids.                           - The examination was otherwise normal on direct                            and retroflexion views. Recommendation:           - Patient has  a contact number available for                            emergencies. The signs and symptoms of potential                            delayed complications were discussed with the                            patient. Return to normal activities tomorrow.                            Written discharge instructions were provided to the                            patient.                           - High fiber diet.                           -  Continue present medications.                           - Await pathology results.                           - Repeat colonoscopy for surveillance based on                            pathology results.                           - The findings and recommendations were discussed                            with the patient's family. Jackquline Denmark, MD 04/18/2022 9:07:06 AM This report has been signed electronically.

## 2022-04-18 NOTE — Progress Notes (Signed)
Cell phone off per pt Pt's states no medical or surgical changes since previsit or office visit.  

## 2022-04-19 ENCOUNTER — Telehealth: Payer: Self-pay | Admitting: *Deleted

## 2022-04-19 NOTE — Telephone Encounter (Signed)
  Follow up Call-     04/18/2022    7:45 AM  Call back number  Post procedure Call Back phone  # [phone]  Permission to leave phone message Yes     Patient questions:  Do you have a fever, pain , or abdominal swelling? No. Pain Score  0 *  Have you tolerated food without any problems? Yes.    Have you been able to return to your normal activities? Yes.    Do you have any questions about your discharge instructions: Diet   No. Medications  No. Follow up visit  No.  Do you have questions or concerns about your Care? No.  Actions: * If pain score is 4 or above: No action needed, pain <4.

## 2022-04-23 ENCOUNTER — Encounter: Payer: Self-pay | Admitting: Gastroenterology

## 2022-07-19 ENCOUNTER — Other Ambulatory Visit: Payer: Self-pay | Admitting: Family

## 2022-07-19 DIAGNOSIS — Z1231 Encounter for screening mammogram for malignant neoplasm of breast: Secondary | ICD-10-CM

## 2022-08-05 ENCOUNTER — Other Ambulatory Visit: Payer: Self-pay | Admitting: Family

## 2022-08-25 ENCOUNTER — Ambulatory Visit
Admission: RE | Admit: 2022-08-25 | Discharge: 2022-08-25 | Disposition: A | Discharge status: Home / Self Care | Payer: BC Managed Care – PPO | Source: Ambulatory Visit | Attending: Family | Admitting: Family

## 2022-08-25 DIAGNOSIS — Z1231 Encounter for screening mammogram for malignant neoplasm of breast: Secondary | ICD-10-CM | POA: Diagnosis not present

## 2022-08-26 ENCOUNTER — Other Ambulatory Visit: Payer: Self-pay | Admitting: Family

## 2022-08-26 DIAGNOSIS — R928 Other abnormal and inconclusive findings on diagnostic imaging of breast: Secondary | ICD-10-CM

## 2022-09-03 ENCOUNTER — Ambulatory Visit
Admission: RE | Admit: 2022-09-03 | Discharge: 2022-09-03 | Disposition: A | Discharge status: Home / Self Care | Payer: BC Managed Care – PPO | Source: Ambulatory Visit | Attending: Family | Admitting: Family

## 2022-09-03 ENCOUNTER — Other Ambulatory Visit: Payer: Self-pay | Admitting: Family

## 2022-09-03 DIAGNOSIS — R928 Other abnormal and inconclusive findings on diagnostic imaging of breast: Secondary | ICD-10-CM

## 2022-09-03 DIAGNOSIS — N6489 Other specified disorders of breast: Secondary | ICD-10-CM | POA: Diagnosis not present

## 2022-09-03 DIAGNOSIS — N6320 Unspecified lump in the left breast, unspecified quadrant: Secondary | ICD-10-CM

## 2022-09-12 ENCOUNTER — Ambulatory Visit
Admission: RE | Admit: 2022-09-12 | Discharge: 2022-09-12 | Disposition: A | Discharge status: Home / Self Care | Payer: BC Managed Care – PPO | Source: Ambulatory Visit | Attending: Family | Admitting: Family

## 2022-09-12 DIAGNOSIS — N6320 Unspecified lump in the left breast, unspecified quadrant: Secondary | ICD-10-CM

## 2022-09-12 DIAGNOSIS — N6321 Unspecified lump in the left breast, upper outer quadrant: Secondary | ICD-10-CM | POA: Diagnosis not present

## 2022-09-12 DIAGNOSIS — N6489 Other specified disorders of breast: Secondary | ICD-10-CM

## 2022-09-12 HISTORY — PX: BREAST BIOPSY: SHX20

## 2022-10-11 ENCOUNTER — Other Ambulatory Visit: Payer: Self-pay | Admitting: General Surgery

## 2022-10-11 DIAGNOSIS — D242 Benign neoplasm of left breast: Secondary | ICD-10-CM | POA: Diagnosis not present

## 2022-10-14 ENCOUNTER — Other Ambulatory Visit: Payer: Self-pay | Admitting: General Surgery

## 2022-10-14 DIAGNOSIS — D242 Benign neoplasm of left breast: Secondary | ICD-10-CM

## 2022-11-16 ENCOUNTER — Encounter (HOSPITAL_BASED_OUTPATIENT_CLINIC_OR_DEPARTMENT_OTHER): Payer: Self-pay | Admitting: General Surgery

## 2022-11-16 ENCOUNTER — Other Ambulatory Visit: Payer: Self-pay

## 2022-11-18 MED ORDER — CHLORHEXIDINE GLUCONATE CLOTH 2 % EX PADS: 6.0000 | MEDICATED_PAD | Freq: Once | CUTANEOUS | Status: DC

## 2022-11-18 MED ORDER — ENSURE PRE-SURGERY PO LIQD: 296.0000 mL | Freq: Once | ORAL | Status: DC

## 2022-11-18 NOTE — Progress Notes (Signed)
Patient Instructions  The night before surgery:  No food after midnight. ONLY clear liquids after midnight  The day of surgery (if you do NOT have diabetes):  Drink ONE (1) Pre-Surgery Clear Ensure as directed.   This drink was given to you during your hospital  pre-op appointment visit. The pre-op nurse will instruct you on the time to drink the  Pre-Surgery Ensure depending on your surgery time. Finish the drink at the designated time by the pre-op nurse.  Nothing else to drink after completing the  Pre-Surgery Clear Ensure.  The day of surgery (if you have diabetes): Drink ONE (1) Gatorade 2 (G2) as directed. This drink was given to you during your hospital  pre-op appointment visit.  The pre-op nurse will instruct you on the time to drink the   Gatorade 2 (G2) depending on your surgery time. Color of the Gatorade may vary. Red is not allowed. Nothing else to drink after completing the  Gatorade 2 (G2).         If you have questions, please contact your surgeon's office.  Surgical soap given to patient with instructions and patient verbalized understanding.

## 2022-11-22 ENCOUNTER — Ambulatory Visit
Admission: RE | Admit: 2022-11-22 | Discharge: 2022-11-22 | Disposition: A | Discharge status: Home / Self Care | Payer: BC Managed Care – PPO | Source: Ambulatory Visit | Attending: General Surgery | Admitting: General Surgery

## 2022-11-22 DIAGNOSIS — D242 Benign neoplasm of left breast: Secondary | ICD-10-CM

## 2022-11-22 DIAGNOSIS — R928 Other abnormal and inconclusive findings on diagnostic imaging of breast: Secondary | ICD-10-CM | POA: Diagnosis not present

## 2022-11-22 HISTORY — PX: BREAST BIOPSY: SHX20

## 2022-11-23 ENCOUNTER — Other Ambulatory Visit: Payer: Self-pay

## 2022-11-23 ENCOUNTER — Ambulatory Visit
Admission: RE | Admit: 2022-11-23 | Discharge: 2022-11-23 | Disposition: A | Discharge status: Home / Self Care | Payer: BC Managed Care – PPO | Source: Ambulatory Visit | Attending: General Surgery | Admitting: General Surgery

## 2022-11-23 ENCOUNTER — Ambulatory Visit (HOSPITAL_BASED_OUTPATIENT_CLINIC_OR_DEPARTMENT_OTHER)
Admission: RE | Admit: 2022-11-23 | Discharge: 2022-11-23 | Disposition: A | Discharge status: Home / Self Care | Payer: BC Managed Care – PPO | Attending: General Surgery | Admitting: General Surgery

## 2022-11-23 ENCOUNTER — Ambulatory Visit (HOSPITAL_BASED_OUTPATIENT_CLINIC_OR_DEPARTMENT_OTHER): Payer: BC Managed Care – PPO | Admitting: Certified Registered Nurse Anesthetist

## 2022-11-23 ENCOUNTER — Encounter (HOSPITAL_BASED_OUTPATIENT_CLINIC_OR_DEPARTMENT_OTHER): Payer: Self-pay | Admitting: General Surgery

## 2022-11-23 ENCOUNTER — Encounter (HOSPITAL_BASED_OUTPATIENT_CLINIC_OR_DEPARTMENT_OTHER)
Admission: RE | Disposition: A | Discharge status: Home / Self Care | Payer: Self-pay | Source: Home / Self Care | Attending: General Surgery

## 2022-11-23 DIAGNOSIS — N62 Hypertrophy of breast: Secondary | ICD-10-CM | POA: Diagnosis not present

## 2022-11-23 DIAGNOSIS — I10 Essential (primary) hypertension: Secondary | ICD-10-CM | POA: Insufficient documentation

## 2022-11-23 DIAGNOSIS — E785 Hyperlipidemia, unspecified: Secondary | ICD-10-CM | POA: Insufficient documentation

## 2022-11-23 DIAGNOSIS — N6489 Other specified disorders of breast: Secondary | ICD-10-CM | POA: Diagnosis not present

## 2022-11-23 DIAGNOSIS — N6012 Diffuse cystic mastopathy of left breast: Secondary | ICD-10-CM | POA: Diagnosis not present

## 2022-11-23 DIAGNOSIS — M1990 Unspecified osteoarthritis, unspecified site: Secondary | ICD-10-CM | POA: Diagnosis not present

## 2022-11-23 DIAGNOSIS — D242 Benign neoplasm of left breast: Secondary | ICD-10-CM | POA: Diagnosis not present

## 2022-11-23 DIAGNOSIS — Z79899 Other long term (current) drug therapy: Secondary | ICD-10-CM | POA: Insufficient documentation

## 2022-11-23 DIAGNOSIS — R928 Other abnormal and inconclusive findings on diagnostic imaging of breast: Secondary | ICD-10-CM | POA: Diagnosis not present

## 2022-11-23 DIAGNOSIS — Z87891 Personal history of nicotine dependence: Secondary | ICD-10-CM | POA: Diagnosis not present

## 2022-11-23 DIAGNOSIS — N6310 Unspecified lump in the right breast, unspecified quadrant: Secondary | ICD-10-CM | POA: Diagnosis not present

## 2022-11-23 DIAGNOSIS — Z01818 Encounter for other preprocedural examination: Secondary | ICD-10-CM

## 2022-11-23 HISTORY — DX: Unspecified osteoarthritis, unspecified site: M19.90

## 2022-11-23 HISTORY — PX: RADIOACTIVE SEED GUIDED EXCISIONAL BREAST BIOPSY: SHX6490

## 2022-11-23 HISTORY — DX: Benign neoplasm, unspecified site: D36.9

## 2022-11-23 SURGERY — RADIOACTIVE SEED GUIDED BREAST BIOPSY
Anesthesia: General | Site: Breast | Laterality: Left

## 2022-11-23 MED ORDER — CEFAZOLIN SODIUM-DEXTROSE 2-4 GM/100ML-% IV SOLN
2.0000 g | INTRAVENOUS | Status: AC
  Administered 2022-11-23: 2 g via INTRAVENOUS

## 2022-11-23 MED ORDER — PHENYLEPHRINE 80 MCG/ML (10ML) SYRINGE FOR IV PUSH (FOR BLOOD PRESSURE SUPPORT)
PREFILLED_SYRINGE | INTRAVENOUS | Status: AC
  Filled 2022-11-23: qty 10

## 2022-11-23 MED ORDER — ONDANSETRON HCL 4 MG/2ML IJ SOLN
INTRAMUSCULAR | Status: DC | PRN
  Administered 2022-11-23: 4 mg via INTRAVENOUS

## 2022-11-23 MED ORDER — LIDOCAINE 2% (20 MG/ML) 5 ML SYRINGE
INTRAMUSCULAR | Status: AC
  Filled 2022-11-23: qty 5

## 2022-11-23 MED ORDER — ACETAMINOPHEN 500 MG PO TABS
ORAL_TABLET | ORAL | Status: AC
  Filled 2022-11-23: qty 2

## 2022-11-23 MED ORDER — FENTANYL CITRATE (PF) 100 MCG/2ML IJ SOLN
INTRAMUSCULAR | Status: DC | PRN
  Administered 2022-11-23: 25 ug via INTRAVENOUS

## 2022-11-23 MED ORDER — LIDOCAINE HCL (CARDIAC) PF 100 MG/5ML IV SOSY
PREFILLED_SYRINGE | INTRAVENOUS | Status: DC | PRN
  Administered 2022-11-23: 80 mg via INTRAVENOUS

## 2022-11-23 MED ORDER — LACTATED RINGERS IV SOLN: INTRAVENOUS | Status: DC

## 2022-11-23 MED ORDER — PROPOFOL 10 MG/ML IV BOLUS
INTRAVENOUS | Status: DC | PRN
  Administered 2022-11-23: 140 mg via INTRAVENOUS

## 2022-11-23 MED ORDER — ACETAMINOPHEN 500 MG PO TABS
1000.0000 mg | ORAL_TABLET | ORAL | Status: AC
  Administered 2022-11-23: 1000 mg via ORAL

## 2022-11-23 MED ORDER — PHENYLEPHRINE HCL (PRESSORS) 10 MG/ML IV SOLN
INTRAVENOUS | Status: DC | PRN
  Administered 2022-11-23 (×2): 80 ug via INTRAVENOUS
  Administered 2022-11-23: 160 ug via INTRAVENOUS

## 2022-11-23 MED ORDER — ONDANSETRON HCL 4 MG/2ML IJ SOLN
INTRAMUSCULAR | Status: AC
  Filled 2022-11-23: qty 2

## 2022-11-23 MED ORDER — MIDAZOLAM HCL 5 MG/5ML IJ SOLN
INTRAMUSCULAR | Status: DC | PRN
  Administered 2022-11-23: 2 mg via INTRAVENOUS

## 2022-11-23 MED ORDER — MIDAZOLAM HCL 2 MG/2ML IJ SOLN
INTRAMUSCULAR | Status: AC
  Filled 2022-11-23: qty 2

## 2022-11-23 MED ORDER — BUPIVACAINE HCL (PF) 0.25 % IJ SOLN
INTRAMUSCULAR | Status: DC | PRN
  Administered 2022-11-23: 10 mL

## 2022-11-23 MED ORDER — DEXAMETHASONE SODIUM PHOSPHATE 10 MG/ML IJ SOLN
INTRAMUSCULAR | Status: DC | PRN
  Administered 2022-11-23: 5 mg via INTRAVENOUS

## 2022-11-23 MED ORDER — EPHEDRINE SULFATE (PRESSORS) 50 MG/ML IJ SOLN
INTRAMUSCULAR | Status: DC | PRN
  Administered 2022-11-23: 10 mg via INTRAVENOUS
  Administered 2022-11-23: 15 mg via INTRAVENOUS

## 2022-11-23 MED ORDER — FENTANYL CITRATE (PF) 100 MCG/2ML IJ SOLN
INTRAMUSCULAR | Status: AC
  Filled 2022-11-23: qty 2

## 2022-11-23 MED ORDER — CEFAZOLIN SODIUM-DEXTROSE 2-4 GM/100ML-% IV SOLN
INTRAVENOUS | Status: AC
  Filled 2022-11-23: qty 100

## 2022-11-23 SURGICAL SUPPLY — 54 items
ADH SKN CLS APL DERMABOND .7 (GAUZE/BANDAGES/DRESSINGS) ×1
APL PRP STRL LF DISP 70% ISPRP (MISCELLANEOUS) ×1
APPLIER CLIP 9.375 MED OPEN (MISCELLANEOUS)
APR CLP MED 9.3 20 MLT OPN (MISCELLANEOUS)
BINDER BREAST LRG (GAUZE/BANDAGES/DRESSINGS) IMPLANT
BINDER BREAST MEDIUM (GAUZE/BANDAGES/DRESSINGS) IMPLANT
BINDER BREAST XLRG (GAUZE/BANDAGES/DRESSINGS) IMPLANT
BINDER BREAST XXLRG (GAUZE/BANDAGES/DRESSINGS) IMPLANT
BLADE SURG 15 STRL LF DISP TIS (BLADE) ×1 IMPLANT
BLADE SURG 15 STRL SS (BLADE) ×1
CANISTER SUC SOCK COL 7IN (MISCELLANEOUS) IMPLANT
CANISTER SUCT 1200ML W/VALVE (MISCELLANEOUS) IMPLANT
CHLORAPREP W/TINT 26 (MISCELLANEOUS) ×1 IMPLANT
CLIP APPLIE 9.375 MED OPEN (MISCELLANEOUS) IMPLANT
CLIP TI WIDE RED SMALL 6 (CLIP) IMPLANT
COVER BACK TABLE 60X90IN (DRAPES) ×1 IMPLANT
COVER MAYO STAND STRL (DRAPES) ×1 IMPLANT
COVER PROBE CYLINDRICAL 5X96 (MISCELLANEOUS) ×1 IMPLANT
DERMABOND ADVANCED .7 DNX12 (GAUZE/BANDAGES/DRESSINGS) ×1 IMPLANT
DRAPE LAPAROSCOPIC ABDOMINAL (DRAPES) ×1 IMPLANT
DRAPE UTILITY XL STRL (DRAPES) ×1 IMPLANT
DRSG TEGADERM 4X4.75 (GAUZE/BANDAGES/DRESSINGS) IMPLANT
ELECT COATED BLADE 2.86 ST (ELECTRODE) ×1 IMPLANT
ELECT REM PT RETURN 9FT ADLT (ELECTROSURGICAL) ×1
ELECTRODE REM PT RTRN 9FT ADLT (ELECTROSURGICAL) ×1 IMPLANT
GAUZE SPONGE 4X4 12PLY STRL LF (GAUZE/BANDAGES/DRESSINGS) IMPLANT
GLOVE BIO SURGEON STRL SZ7 (GLOVE) ×2 IMPLANT
GLOVE BIOGEL PI IND STRL 7.5 (GLOVE) ×1 IMPLANT
GOWN STRL REUS W/ TWL LRG LVL3 (GOWN DISPOSABLE) ×2 IMPLANT
GOWN STRL REUS W/TWL LRG LVL3 (GOWN DISPOSABLE) ×2
HEMOSTAT ARISTA ABSORB 3G PWDR (HEMOSTASIS) IMPLANT
KIT MARKER MARGIN INK (KITS) ×1 IMPLANT
NDL HYPO 25X1 1.5 SAFETY (NEEDLE) ×1 IMPLANT
NEEDLE HYPO 25X1 1.5 SAFETY (NEEDLE) ×1 IMPLANT
NS IRRIG 1000ML POUR BTL (IV SOLUTION) IMPLANT
PACK BASIN DAY SURGERY FS (CUSTOM PROCEDURE TRAY) ×1 IMPLANT
PENCIL SMOKE EVACUATOR (MISCELLANEOUS) ×1 IMPLANT
RETRACTOR ONETRAX LX 90X20 (MISCELLANEOUS) IMPLANT
SLEEVE SCD COMPRESS KNEE MED (STOCKING) ×1 IMPLANT
SPIKE FLUID TRANSFER (MISCELLANEOUS) IMPLANT
SPONGE T-LAP 4X18 ~~LOC~~+RFID (SPONGE) ×1 IMPLANT
STRIP CLOSURE SKIN 1/2X4 (GAUZE/BANDAGES/DRESSINGS) ×1 IMPLANT
SUT MNCRL AB 4-0 PS2 18 (SUTURE) IMPLANT
SUT MON AB 5-0 PS2 18 (SUTURE) IMPLANT
SUT SILK 2 0 SH (SUTURE) IMPLANT
SUT VIC AB 2-0 SH 27 (SUTURE) ×2
SUT VIC AB 2-0 SH 27XBRD (SUTURE) ×1 IMPLANT
SUT VIC AB 3-0 SH 27 (SUTURE) ×1
SUT VIC AB 3-0 SH 27X BRD (SUTURE) ×1 IMPLANT
SYR CONTROL 10ML LL (SYRINGE) ×1 IMPLANT
TOWEL GREEN STERILE FF (TOWEL DISPOSABLE) ×1 IMPLANT
TRAY FAXITRON CT DISP (TRAY / TRAY PROCEDURE) ×1 IMPLANT
TUBE CONNECTING 20X1/4 (TUBING) IMPLANT
YANKAUER SUCT BULB TIP NO VENT (SUCTIONS) IMPLANT

## 2022-11-23 NOTE — Interval H&P Note (Signed)
History and Physical Interval Note:  11/23/2022 1:28 PM  Melanie Douglas  has presented today for surgery, with the diagnosis of LEFT BREAST PAPILLOMA.  The various methods of treatment have been discussed with the patient and family. After consideration of risks, benefits and other options for treatment, the patient has consented to  Procedure(s): LEFT BRACKETED RADIOACTIVE SEED GUIDED EXCISIONAL BREAST BIOPSY (Left) as a surgical intervention.  The patient's history has been reviewed, patient examined, no change in status, stable for surgery.  I have reviewed the patient's chart and labs.  Questions were answered to the patient's satisfaction.     Rolm Bookbinder

## 2022-11-23 NOTE — Transfer of Care (Signed)
Immediate Anesthesia Transfer of Care Note  Patient: Melanie Douglas  Procedure(s) Performed: LEFT BRACKETED RADIOACTIVE SEED GUIDED EXCISIONAL BREAST BIOPSY (Left: Breast)  Patient Location: PACU  Anesthesia Type:General  Level of Consciousness: awake, alert , and oriented  Airway & Oxygen Therapy: Patient Spontanous Breathing and Patient connected to face mask oxygen  Post-op Assessment: Report given to RN and Post -op Vital signs reviewed and stable  Post vital signs: Reviewed and stable  Last Vitals:  Vitals Value Taken Time  BP    Temp    Pulse 95 11/23/22 1448  Resp    SpO2 98 % 11/23/22 1448  Vitals shown include unvalidated device data.  Last Pain:  Vitals:   11/23/22 1235  TempSrc: Oral  PainSc: 0-No pain         Complications: No notable events documented.

## 2022-11-23 NOTE — Anesthesia Procedure Notes (Signed)
Procedure Name: LMA Insertion Date/Time: 11/23/2022 1:54 PM  Performed by: Lavonia Dana, CRNAPre-anesthesia Checklist: Patient identified, Emergency Drugs available, Suction available and Patient being monitored Patient Re-evaluated:Patient Re-evaluated prior to induction Oxygen Delivery Method: Circle system utilized Preoxygenation: Pre-oxygenation with 100% oxygen Induction Type: IV induction Ventilation: Mask ventilation without difficulty LMA: LMA inserted LMA Size: 4.0 Number of attempts: 1 Airway Equipment and Method: Bite block Placement Confirmation: positive ETCO2 Tube secured with: Tape Dental Injury: Teeth and Oropharynx as per pre-operative assessment

## 2022-11-23 NOTE — H&P (Signed)
  77 yof I know from right breast papilloma that we just followed. This is resolved. No family history. She has no mass or dc. She had mm that shows b density breasts. This showed a left breast asymmetry. US shows 2 indeterminate masses at 1 oclock. There is no axillary adenopathy. Biopsy of both of those are done with clips placed. The clips were 1.5 cm apart. 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 She is here to discuss options.   Review of Systems: A complete review of systems was obtained from the patient. I have reviewed this information and discussed as appropriate with the patient. See HPI as well for other ROS.  Review of Systems  All other systems reviewed and are negative.  Medical History: Past Medical History:  Diagnosis Date  Arthritis  Hyperlipidemia  Hypertension   There is no problem list on file for this patient.  History reviewed. No pertinent surgical history.   No Known Allergies  Current Outpatient Medications on File Prior to Visit  Medication Sig Dispense Refill  atorvastatin (LIPITOR) 20 MG tablet Take 1 tablet by mouth once daily  cholecalciferol (VITAMIN D3) 1000 unit tablet Take by mouth  ibuprofen (MOTRIN) 800 MG tablet Take by mouth  nebivoloL (BYSTOLIC) 10 MG tablet Take 1 tablet by mouth once daily   History reviewed. No pertinent family history.   Social History   Tobacco Use  Smoking Status Former  Types: Cigarettes  Quit date: 2017  Years since quitting: 6.9  Smokeless Tobacco Never  Marital status: Married  Tobacco Use  Smoking status: Former  Types: Cigarettes  Quit date: 2017  Years since quitting: 6.9  Smokeless tobacco: Never  Substance and Sexual Activity  Alcohol use: Yes  Drug use: Never   Objective:   Vitals:  10/11/22 0906  BP: (!) 144/82  Pulse: 84  Temp: 36.2 C (97.1 F)  SpO2: 95%  Weight: 82.5 kg (181 lb 12.8 oz)  Height: 167.6 cm ('5\' 6"'$ )   Body mass index is 29.34  kg/m.  Physical Exam Vitals reviewed.  Constitutional:  Appearance: Normal appearance.  Chest:  Breasts: Right: No inverted nipple, mass or nipple discharge.  Left: No inverted nipple, mass or nipple discharge.  Lymphadenopathy:  Upper Body:  Right upper body: No supraclavicular or axillary adenopathy.  Left upper body: No supraclavicular or axillary adenopathy.  Neurological:  Mental Status: She is alert.   Assessment and Plan:   Intraductal papilloma of breast, left  Left breast seed bracketed excisional biopsy  Per the [facility] protocol we recommend excision. This area is larger and there are two with associated asymmetry. We discussed observation but risk of upgrade with this one is certainly possible. We discussed excision with 2 seeds, outpatient surgery, risks and recovery. Will schedule

## 2022-11-23 NOTE — Discharge Instructions (Addendum)
Post Anesthesia Home Care Instructions  Activity: Get plenty of rest for the remainder of the day. A responsible individual must stay with you for 24 hours following the procedure.  For the next 24 hours, DO NOT: -Drive a car -Paediatric nurse -Drink alcoholic beverages -Take any medication unless instructed by your physician -Make any legal decisions or sign important papers.  Meals: Start with liquid foods such as gelatin or soup. Progress to regular foods as tolerated. Avoid greasy, spicy, heavy foods. If nausea and/or vomiting occur, drink only clear liquids until the nausea and/or vomiting subsides. Call your physician if vomiting continues.  Special Instructions/Symptoms: Your throat may feel dry or sore from the anesthesia or the breathing tube placed in your throat during surgery. If this causes discomfort, gargle with warm salt water. The discomfort should disappear within 24 hours.  If you had a scopolamine patch placed behind your ear for the management of post- operative nausea and/or vomiting:  1. The medication in the patch is effective for 72 hours, after which it should be removed.  Wrap patch in a tissue and discard in the trash. Wash hands thoroughly with soap and water. 2. You may remove the patch earlier than 72 hours if you experience unpleasant side effects which may include dry mouth, dizziness or visual disturbances. 3. Avoid touching the patch. Wash your hands with soap and water after contact with the patch.    Next dose of Tylenol may be taken at [facility] Phone Number [phone]  POST OP INSTRUCTIONS Take 400 mg of ibuprofen every 8 hours or 650 mg tylenol every 6 hours for next 72 hours then as needed. Use ice several times daily also.  A prescription for pain medication may be given to you upon discharge.  Take your pain medication as prescribed, if needed.  If narcotic pain medicine is not needed, then you may take  acetaminophen (Tylenol), naprosyn (Alleve) or ibuprofen (Advil) as needed. Take your usually prescribed medications unless otherwise directed If you need a refill on your pain medication, please contact your pharmacy.  They will contact our office to request authorization.  Prescriptions will not be filled after 5pm or on week-ends. You should eat very light the first 24 hours after surgery, such as soup, crackers, pudding, etc.  Resume your normal diet the day after surgery. Most patients will experience some swelling and bruising in the breast.  Ice packs and a good support bra will help.  Wear the breast binder provided or a sports bra for 72 hours day and night.  After that wear a sports bra during the day until you return to the office. Swelling and bruising can take several days to resolve.  It is common to experience some constipation if taking pain medication after surgery.  Increasing fluid intake and taking a stool softener will usually help or prevent this problem from occurring.  A mild laxative (Milk of Magnesia or Miralax) should be taken according to package directions if there are no bowel movements after 48 hours. I used skin glue on the incision, you may shower in 24 hours.  The glue will flake off over the next 2-3 weeks.  Any sutures or staples will be removed at the office during your follow-up visit. ACTIVITIES:  You may resume regular daily activities (gradually increasing) beginning the next day.  Wearing a good support bra or sports bra minimizes pain and swelling.  You may have sexual intercourse when it is  comfortable. You may drive when you no longer are taking prescription pain medication, you can comfortably wear a seatbelt, and you can safely maneuver your car and apply brakes. RETURN TO WORK:  ______________________________________________________________________________________ Dennis Bast should see your doctor in the office for a follow-up appointment approximately two weeks  after your surgery.  Your doctor's nurse will typically make your follow-up appointment when she calls you with your pathology report.  Expect your pathology report 3-4 business days after your surgery.  You may call to check if you do not hear from [place] after three days. OTHER INSTRUCTIONS: _______________________________________________________________________________________________ _____________________________________________________________________________________________________________________________________ _____________________________________________________________________________________________________________________________________ _____________________________________________________________________________________________________________________________________  WHEN TO CALL DR WAKEFIELD: Fever over 101.0 Nausea and/or vomiting. Extreme swelling or bruising. Continued bleeding from incision. Increased pain, redness, or drainage from the incision.  The clinic staff is available to answer your questions during regular business hours.  Please don't hesitate to call and ask to speak to one of the nurses for clinical concerns.  If you have a medical emergency, go to the nearest emergency room or call 911.  A surgeon from [facility] is always on call at the hospital.  For further questions, please visit [email or web address]

## 2022-11-23 NOTE — Anesthesia Preprocedure Evaluation (Addendum)
Anesthesia Evaluation  Patient identified by MRN, date of birth, ID band Patient awake    Reviewed: Allergy & Precautions, NPO status , Patient's Chart, lab work & pertinent test results  Airway Mallampati: II  TM Distance: >3 FB Neck ROM: Full    Dental  (+) Missing, Dental Advisory Given   Pulmonary former smoker   Pulmonary exam normal breath sounds clear to auscultation       Cardiovascular hypertension, Pt. on medications and Pt. on home beta blockers Normal cardiovascular exam Rhythm:Regular Rate:Normal     Neuro/Psych negative neurological ROS     GI/Hepatic negative GI ROS, Neg liver ROS,,,  Endo/Other  negative endocrine ROS    Renal/GU negative Renal ROS     Musculoskeletal  (+) Arthritis ,    Abdominal  (+) + obese  Peds  Hematology negative hematology ROS (+)   Anesthesia Other Findings   Reproductive/Obstetrics negative OB ROS                              Anesthesia Physical Anesthesia Plan  ASA: 2  Anesthesia Plan: General   Post-op Pain Management: Tylenol PO (pre-op)* and Minimal or no pain anticipated   Induction: Intravenous  PONV Risk Score and Plan: 4 or greater and Ondansetron, Dexamethasone, Treatment may vary due to age or medical condition and Midazolam  Airway Management Planned: LMA  Additional Equipment:   Intra-op Plan:   Post-operative Plan: Extubation in OR  Informed Consent: I have reviewed the patients History and Physical, chart, labs and discussed the procedure including the risks, benefits and alternatives for the proposed anesthesia with the patient or authorized representative who has indicated his/her understanding and acceptance.     Dental advisory given  Plan Discussed with: CRNA  Anesthesia Plan Comments:          Anesthesia Quick Evaluation

## 2022-11-23 NOTE — Op Note (Addendum)
Preoperative diagnosis: Left breast masses Postoperative diagnosis: Same as above Procedure: Left breast radioactive seed bracketed excisional biopsy Surgeon: Dr. Serita Grammes Anesthesia: General Estimated blood loss: Minimal Specimens: Left breast tissue containing seeds and clips confirmed by mammography Complications: None Drains: None Sponge needle count was correct completion Disposition to recovery in stable condition   Indications: 73 yof I know from right breast papilloma that we just followed. This is resolved. No family history. She has no mass or dc. She had mm that shows b density breasts. This showed a left breast asymmetry. US shows 2 indeterminate masses at 1 oclock. There is no axillary adenopathy. Biopsy of both of those are done with clips placed. The clips were 1.5 cm apart. 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 We discussed excision per our local protocol.    Procedure: After informed consent was obtained the patient was taken to the operating room.  She was given antibiotics.  SCDs were in place.  She was placed under general anesthesia without complication.  She was prepped and draped in the standard sterile surgical fashion.  Surgical timeout was then performed.   I identified the seeds in the central breast.  I infiltrated Marcaine.  I made a periareolar incision in order to hide the scar later.  I then dissected down to the seeds.   I removed the seeds and some of the surrounding tissue.  Mammogram confirmed removal of the seeds as well as the clips.  This was sent to pathology.  I then obtained hemostasis.  I closed the breast tissue with 2-0 Vicryl.  The skin was closed with 3-0 Vicryl and 5-0 Monocryl.  Glue and Steri-Strips were applied.  She tolerated this well was extubated and transferred to recovery stable.

## 2022-11-24 ENCOUNTER — Encounter (HOSPITAL_COMMUNITY): Payer: Self-pay

## 2022-11-24 ENCOUNTER — Encounter (HOSPITAL_BASED_OUTPATIENT_CLINIC_OR_DEPARTMENT_OTHER): Payer: Self-pay | Admitting: General Surgery

## 2022-11-24 NOTE — Progress Notes (Signed)
Left message stating courtesy call and if any questions or concerns please call the doctors office.  

## 2022-11-24 NOTE — Anesthesia Postprocedure Evaluation (Signed)
Anesthesia Post Note  Patient: Melanie Douglas  Procedure(s) Performed: LEFT BRACKETED RADIOACTIVE SEED GUIDED EXCISIONAL BREAST BIOPSY (Left: Breast)     Patient location during evaluation: PACU Anesthesia Type: General Level of consciousness: sedated and patient cooperative Pain management: pain level controlled Vital Signs Assessment: post-procedure vital signs reviewed and stable Respiratory status: spontaneous breathing Cardiovascular status: stable Anesthetic complications: no   No notable events documented.  Last Vitals:  Vitals:   11/23/22 1515 11/23/22 1523  BP:  120/72  Pulse: 90 93  Resp: (!) 21 20  Temp:  (!) 36.3 C  SpO2: 94% 94%    Last Pain:  Vitals:   11/23/22 1523  TempSrc: Tympanic  PainSc: 0-No pain                 Nolon Nations

## 2022-11-25 LAB — SURGICAL PATHOLOGY

## 2022-12-06 ENCOUNTER — Encounter: Payer: Self-pay | Admitting: Family

## 2022-12-06 DIAGNOSIS — I10 Essential (primary) hypertension: Secondary | ICD-10-CM

## 2022-12-06 MED ORDER — NEBIVOLOL HCL 10 MG PO TABS: 10.0000 mg | ORAL_TABLET | Freq: Every day | ORAL | 1 refills | Status: DC

## 2023-02-02 ENCOUNTER — Other Ambulatory Visit: Payer: Self-pay | Admitting: Family

## 2023-02-10 ENCOUNTER — Ambulatory Visit (INDEPENDENT_AMBULATORY_CARE_PROVIDER_SITE_OTHER): Payer: BC Managed Care – PPO | Admitting: Family

## 2023-02-10 ENCOUNTER — Other Ambulatory Visit: Payer: Self-pay | Admitting: Family

## 2023-02-10 ENCOUNTER — Encounter: Payer: Self-pay | Admitting: Family

## 2023-02-10 VITALS — BP 140/86 | HR 80 | Temp 97.5°F | Resp 18 | Ht 66.0 in | Wt 183.8 lb

## 2023-02-10 DIAGNOSIS — Z0000 Encounter for general adult medical examination without abnormal findings: Secondary | ICD-10-CM | POA: Diagnosis not present

## 2023-02-10 DIAGNOSIS — Z13220 Encounter for screening for lipoid disorders: Secondary | ICD-10-CM | POA: Diagnosis not present

## 2023-02-10 DIAGNOSIS — R7309 Other abnormal glucose: Secondary | ICD-10-CM

## 2023-02-10 DIAGNOSIS — Z87891 Personal history of nicotine dependence: Secondary | ICD-10-CM

## 2023-02-10 LAB — CBC WITH DIFFERENTIAL/PLATELET
Basophils Absolute: 0.1 10*3/uL (ref 0.0–0.1)
Basophils Relative: 1 % (ref 0.0–3.0)
Eosinophils Absolute: 0.2 10*3/uL (ref 0.0–0.7)
Eosinophils Relative: 2 % (ref 0.0–5.0)
HCT: 40.4 % (ref 36.0–46.0)
Hemoglobin: 13.7 g/dL (ref 12.0–15.0)
Lymphocytes Relative: 25.1 % (ref 12.0–46.0)
Lymphs Abs: 1.8 10*3/uL (ref 0.7–4.0)
MCHC: 33.9 g/dL (ref 30.0–36.0)
MCV: 93.7 fl (ref 78.0–100.0)
Monocytes Absolute: 0.6 10*3/uL (ref 0.1–1.0)
Monocytes Relative: 8.8 % (ref 3.0–12.0)
Neutro Abs: 4.6 10*3/uL (ref 1.4–7.7)
Neutrophils Relative %: 63.1 % (ref 43.0–77.0)
Platelets: 270 10*3/uL (ref 150.0–400.0)
RBC: 4.31 Mil/uL (ref 3.87–5.11)
RDW: 13 % (ref 11.5–15.5)
WBC: 7.3 10*3/uL (ref 4.0–10.5)

## 2023-02-10 LAB — COMPREHENSIVE METABOLIC PANEL
ALT: 13 U/L (ref 0–35)
AST: 10 U/L (ref 0–37)
Albumin: 4.3 g/dL (ref 3.5–5.2)
Alkaline Phosphatase: 69 U/L (ref 39–117)
BUN: 19 mg/dL (ref 6–23)
CO2: 26 mEq/L (ref 19–32)
Calcium: 9.5 mg/dL (ref 8.4–10.5)
Chloride: 106 mEq/L (ref 96–112)
Creatinine, Ser: 0.87 mg/dL (ref 0.40–1.20)
GFR: 69.73 mL/min (ref >60.00)
Glucose, Bld: 100 mg/dL — ABNORMAL HIGH (ref 70–99)
Potassium: 3.9 mEq/L (ref 3.5–5.1)
Sodium: 140 mEq/L (ref 135–145)
Total Bilirubin: 0.3 mg/dL (ref 0.2–1.2)
Total Protein: 6.8 g/dL (ref 6.0–8.3)

## 2023-02-10 LAB — LIPID PANEL
Cholesterol: 181 mg/dL (ref 0–200)
HDL: 50.7 mg/dL (ref >39.00)
NonHDL: 130.05
Total CHOL/HDL Ratio: 4
Triglycerides: 273 mg/dL — ABNORMAL HIGH (ref 0.0–149.0)
VLDL: 54.6 mg/dL — ABNORMAL HIGH (ref 0.0–40.0)

## 2023-02-10 LAB — HEMOGLOBIN A1C: Hgb A1c MFr Bld: 6.1 % (ref 4.6–6.5)

## 2023-02-10 LAB — LDL CHOLESTEROL, DIRECT: Direct LDL: 98 mg/dL

## 2023-02-10 MED ORDER — ROSUVASTATIN CALCIUM 10 MG PO TABS: 10.0000 mg | ORAL_TABLET | Freq: Every day | ORAL | 3 refills | Status: DC

## 2023-02-10 NOTE — Progress Notes (Signed)
Melanie Douglas is a 67 y.o. female with the following history as recorded in EpicCare:  Patient Active Problem List   Diagnosis Date Noted   Essential hypertension 09/18/2015   Left arm pain 08/31/2015   Lung nodule 08/31/2015    Current Outpatient Medications  Medication Sig Dispense Refill   atorvastatin (LIPITOR) 20 MG tablet TAKE 1 TABLET BY MOUTH EVERY DAY 90 tablet 1   cholecalciferol (VITAMIN D3) 25 MCG (1000 UNIT) tablet Take 1,000 Units by mouth daily.     ibuprofen (ADVIL) 800 MG tablet Take 1 tablet (800 mg total) by mouth every 8 (eight) hours as needed. (Patient taking differently: Take 800 mg by mouth every 8 (eight) hours as needed for moderate pain.) 90 tablet 0   nebivolol (BYSTOLIC) 10 MG tablet Take 1 tablet (10 mg total) by mouth daily. 90 tablet 1   Omega-3 Fatty Acids (FISH OIL) 1000 MG CAPS Take by mouth.     Turmeric 500 MG CAPS Take by mouth.     vitamin E 180 MG (400 UNITS) capsule Take 400 Units by mouth daily.     No current facility-administered medications for this visit.    Allergies: Patient has no known allergies.  Past Medical History:  Diagnosis Date   Arthritis    hands, left elbow   Chicken pox    Hyperlipidemia    Hypertension    Papilloma    left breast   UTI (lower urinary tract infection)     Past Surgical History:  Procedure Laterality Date   BREAST BIOPSY Left 09/12/2022   US LT BREAST BX W LOC DEV 1ST LESION IMG BX SPEC US GUIDE 09/12/2022 [facility]   BREAST BIOPSY Left 09/12/2022   US LT BREAST BX W LOC DEV EA ADD LESION IMG BX SPEC US GUIDE 09/12/2022 [facility]   BREAST BIOPSY  11/22/2022   MM LT RADIOACTIVE SEED EA ADD LESION LOC MAMMO GUIDE 11/22/2022 [facility]   BREAST BIOPSY  11/22/2022   MM LT RADIOACTIVE SEED LOC MAMMO GUIDE 11/22/2022 [facility]   COLONOSCOPY  07/16/2018   Dr.Gupta   RADIOACTIVE SEED GUIDED EXCISIONAL BREAST BIOPSY Left 11/23/2022   Procedure: LEFT BRACKETED RADIOACTIVE SEED  GUIDED EXCISIONAL BREAST BIOPSY;  Surgeon: Emelia LoronWakefield, Matthew, MD;  Location: [facility];  Service: General;  Laterality: Left;    Family History  Problem Relation Age of Onset   Hyperlipidemia Mother    Hypertension Mother    Healthy Father    Healthy Maternal Grandmother    Prostate cancer Maternal Grandfather    Healthy Paternal Grandmother    Healthy Paternal Grandfather    Colon cancer Maternal Uncle    Colon polyps Neg Hx    Esophageal cancer Neg Hx    Stomach cancer Neg Hx    Rectal cancer Neg Hx    Breast cancer Neg Hx     Social History   Tobacco Use   Smoking status: Former    Packs/day: 1.00    Years: 40.00    Additional pack years: 0.00    Total pack years: 40.00    Types: Cigarettes    Quit date: 12/02/2014    Years since quitting: 8.1   Smokeless tobacco: Never  Substance Use Topics   Alcohol use: Yes    Alcohol/week: 14.0 standard drinks of alcohol    Types: 14 Glasses of wine per week    Comment: daily-wine 2 glasses    Subjective:   Presents for yearly CPE;  has noticed that blood pressure has not been as well controlled at home recently; Denies any chest pain, shortness of breath, blurred vision or headache Up to date on dental and vision exams; has been released by breast surgeon;   Review of Systems  Constitutional: Negative.   HENT: Negative.    Eyes: Negative.   Respiratory: Negative.    Cardiovascular: Negative.   Gastrointestinal: Negative.   Genitourinary: Negative.   Musculoskeletal: Negative.   Skin: Negative.   Neurological: Negative.   Endo/Heme/Allergies: Negative.   Psychiatric/Behavioral: Negative.       Objective:  Vitals:   02/10/23 0934 02/10/23 1026  BP: (!) 160/88 (!) 140/86  Pulse: 80   Resp: 18   Temp: (!) 97.5 F (36.4 C)   TempSrc: Oral   SpO2: 97%   Weight: 183 lb 12.8 oz (83.4 kg)   Height: 5\' 6"  (1.676 m)     General: Well developed, well nourished, in no acute distress  Skin : Warm and  dry.  Head: Normocephalic and atraumatic  Eyes: Sclera and conjunctiva clear; pupils round and reactive to light; extraocular movements intact  Ears: External normal; canals clear; tympanic membranes normal  Oropharynx: Pink, supple. No suspicious lesions  Neck: Supple without thyromegaly, adenopathy  Lungs: Respirations unlabored; clear to auscultation bilaterally without wheeze, rales, rhonchi  CVS exam: normal rate and regular rhythm.  Abdomen: Soft; nontender; nondistended; normoactive bowel sounds; no masses or hepatosplenomegaly  Musculoskeletal: No deformities; no active joint inflammation  Extremities: No edema, cyanosis, clubbing  Vessels: Symmetric bilaterally  Neurologic: Alert and oriented; speech intact; face symmetrical; moves all extremities well; CNII-XII intact without focal deficit   Assessment:  1. PE (physical exam), annual   2. History of tobacco abuse   3. Lipid screening   4. Elevated glucose     Plan:  Age appropriate preventive healthcare needs addressed; encouraged regular eye doctor and dental exams; encouraged regular exercise; will update labs and refills as needed today; follow-up to be determined; Patient will try increasing Bystolic to 20 mg daily- she will call back with response in 1-2 weeks; Lung cancer screen ordered today;   No follow-ups on file.  Orders Placed This Encounter  Procedures   CBC with Differential/Platelet   Comp Met (CMET)   Lipid panel   Hemoglobin A1c   Ambulatory Referral Lung Cancer Screening [facility]    Referral Priority:   Routine    Referral Type:   Consultation    Referral Reason:   Specialty Services Required    Number of Visits Requested:   1    Requested Prescriptions    No prescriptions requested or ordered in this encounter

## 2023-02-17 ENCOUNTER — Other Ambulatory Visit: Payer: Self-pay | Admitting: Family

## 2023-02-17 ENCOUNTER — Encounter: Payer: Self-pay | Admitting: Family

## 2023-02-17 DIAGNOSIS — I10 Essential (primary) hypertension: Secondary | ICD-10-CM

## 2023-02-17 MED ORDER — NEBIVOLOL HCL 20 MG PO TABS: 20.0000 mg | ORAL_TABLET | Freq: Every day | ORAL | 3 refills | Status: DC

## 2023-05-29 ENCOUNTER — Encounter: Payer: Self-pay | Admitting: *Deleted

## 2023-06-26 ENCOUNTER — Other Ambulatory Visit: Payer: Self-pay | Admitting: Family

## 2023-06-26 MED ORDER — IBUPROFEN 800 MG PO TABS: 800.0000 mg | ORAL_TABLET | Freq: Three times a day (TID) | ORAL | 0 refills | Status: AC | PRN

## 2023-07-02 IMAGING — MG DIGITAL DIAGNOSTIC BILAT W/ TOMO W/ CAD
8 series · 8 of 24 positions shown · non-contrast
Comparison: Previous exam(s).

CLINICAL DATA: 65-year-old female presenting for annual bilateral
mammogram. History 2 biopsy-proven right breast papillomas which the
patient has chosen not to excise.

EXAM:
DIGITAL DIAGNOSTIC BILATERAL MAMMOGRAM WITH TOMOSYNTHESIS AND CAD
TECHNIQUE: Bilateral digital diagnostic mammography and breast tomosynthesis
was performed. The images were evaluated with computer-aided
detection.

[L MLO synth-2D]
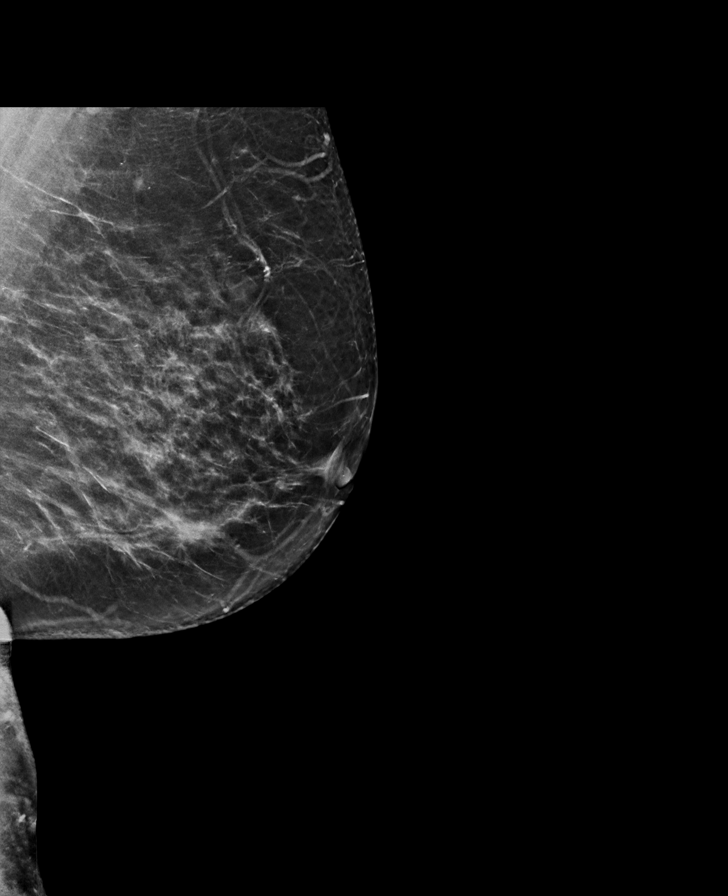

[R CC synth-2D]
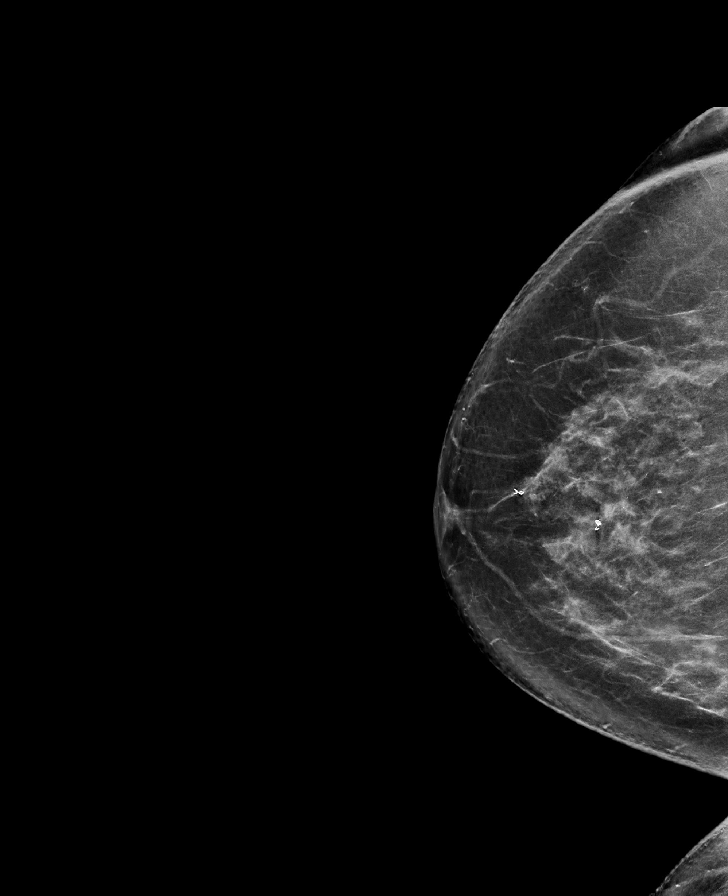

[L CC synth-2D]
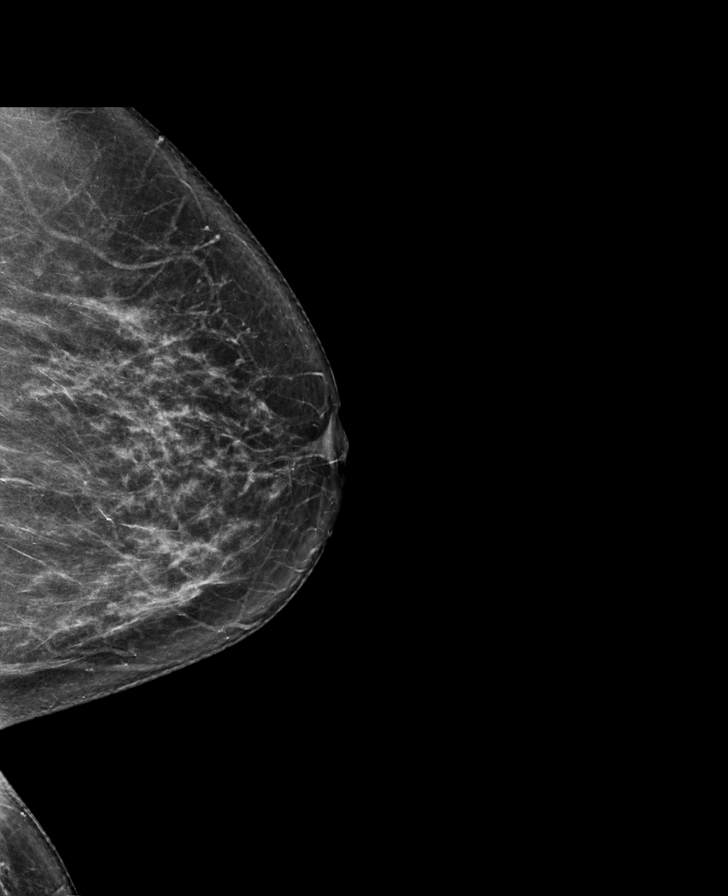

[R MLO synth-2D]
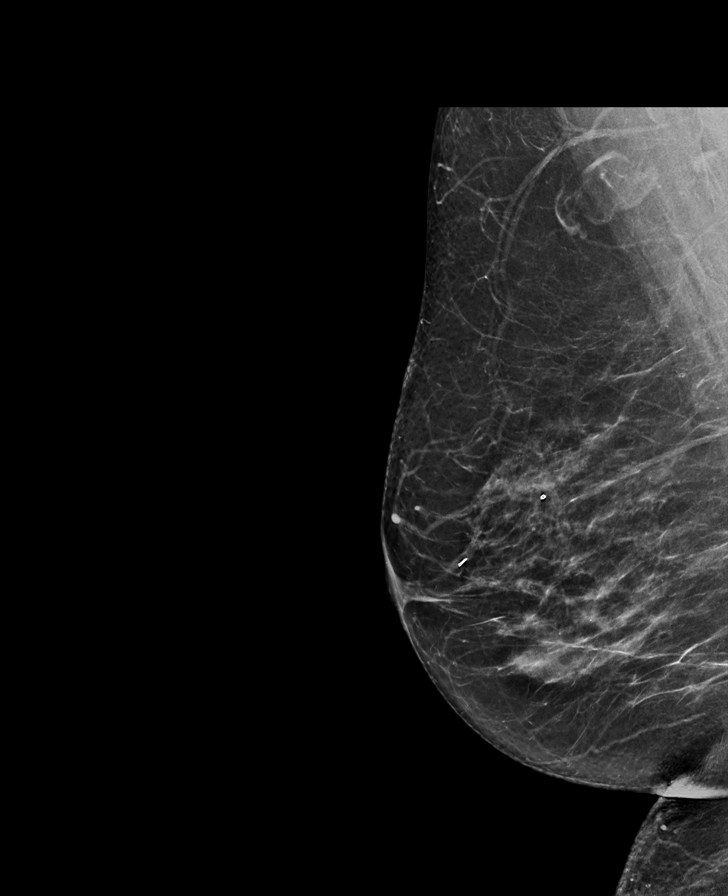

[R CC tomo · tomo slice 41/81.0]
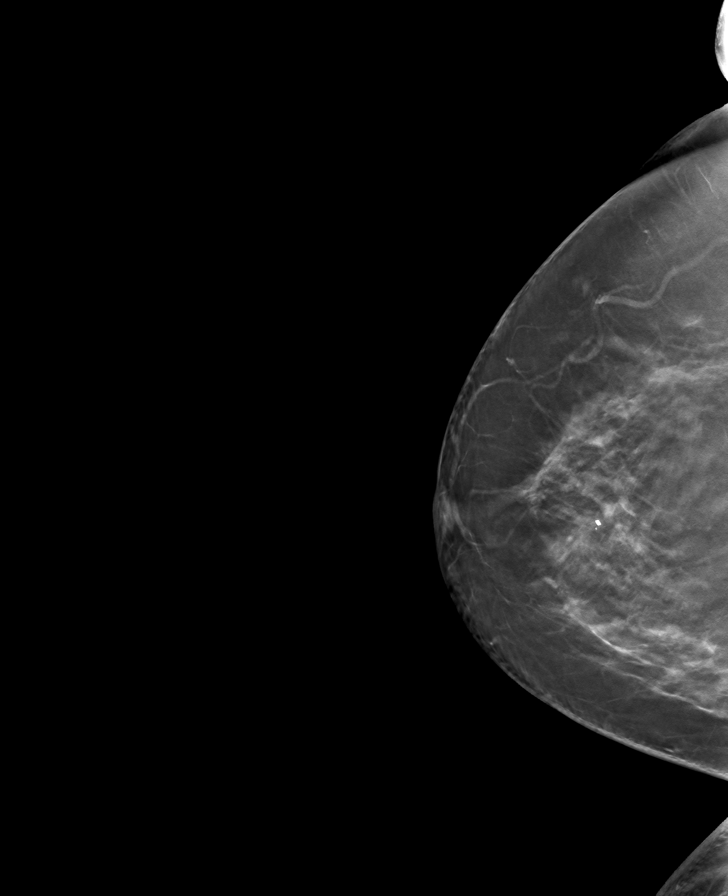

[L MLO tomo · tomo slice 35/70.0]
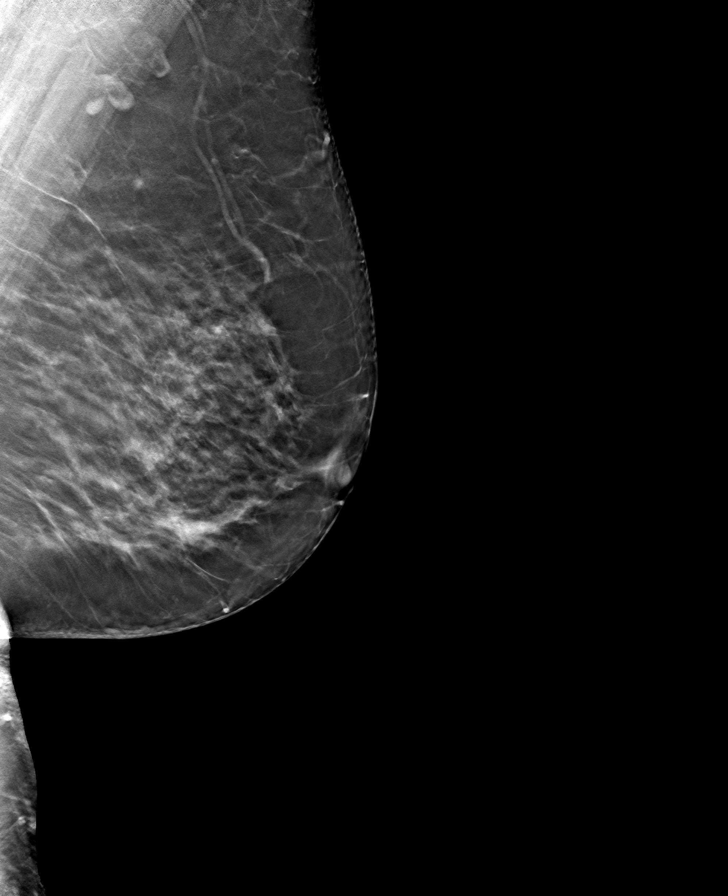

[L CC tomo · tomo slice 40/79.0]
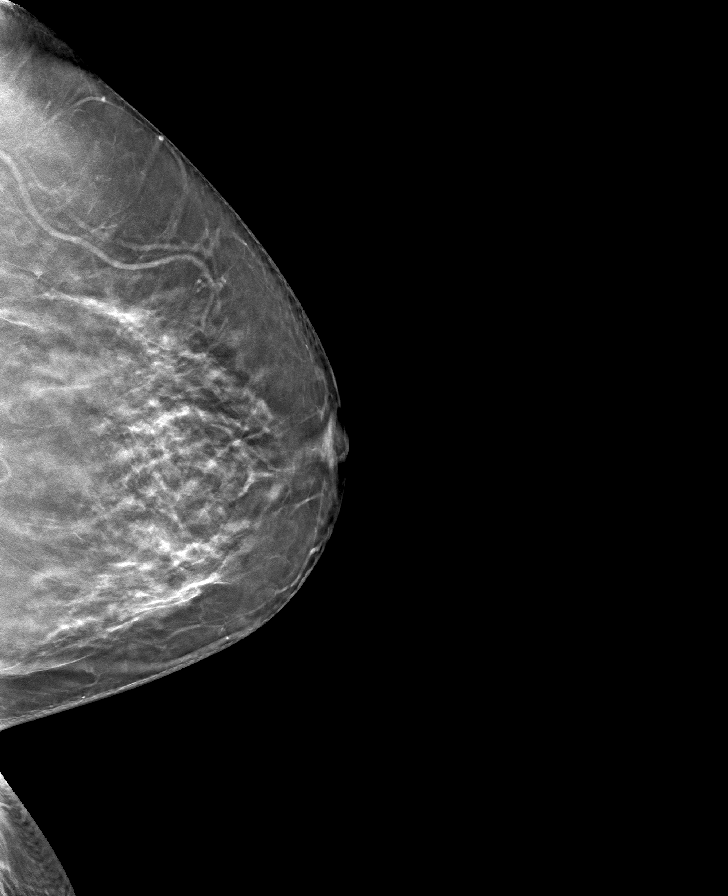

[R MLO tomo · tomo slice 35/69.0]
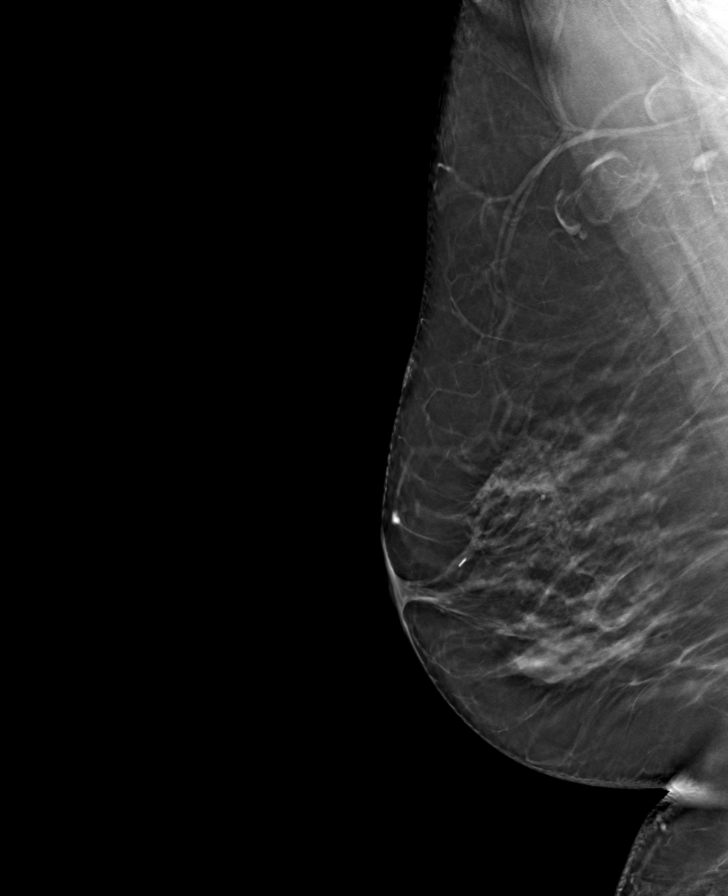

[8 of 24 positions shown; findings below may reference images not displayed]

ACR Breast Density Category c: The breast tissue is heterogeneously
dense, which may obscure small masses.
FINDINGS: Stable post biopsy changes at the central right breast. Otherwise,
no new or suspicious findings in either breast. The parenchymal
pattern is stable.
IMPRESSION: 1. No mammographic evidence of malignancy in either breast.
2. Right breast post biopsy changes consistent with 2 biopsy-proven
fibroadenomas, unchanged since 9499.

RECOMMENDATION:
Screening mammogram in one year.(Code:VI-I-06O)

I have discussed the findings and recommendations with the patient.
If applicable, a reminder letter will be sent to the patient
regarding the next appointment.

BI-RADS CATEGORY  2: Benign.

## 2023-10-22 IMAGING — CR DG CHEST 2V
2 series · 2 of 2 positions shown · non-contrast
Comparison: 08/24/2015

CLINICAL DATA: Tachycardia, hypertension

EXAM:
CHEST - 2 VIEW

[w chest lat]
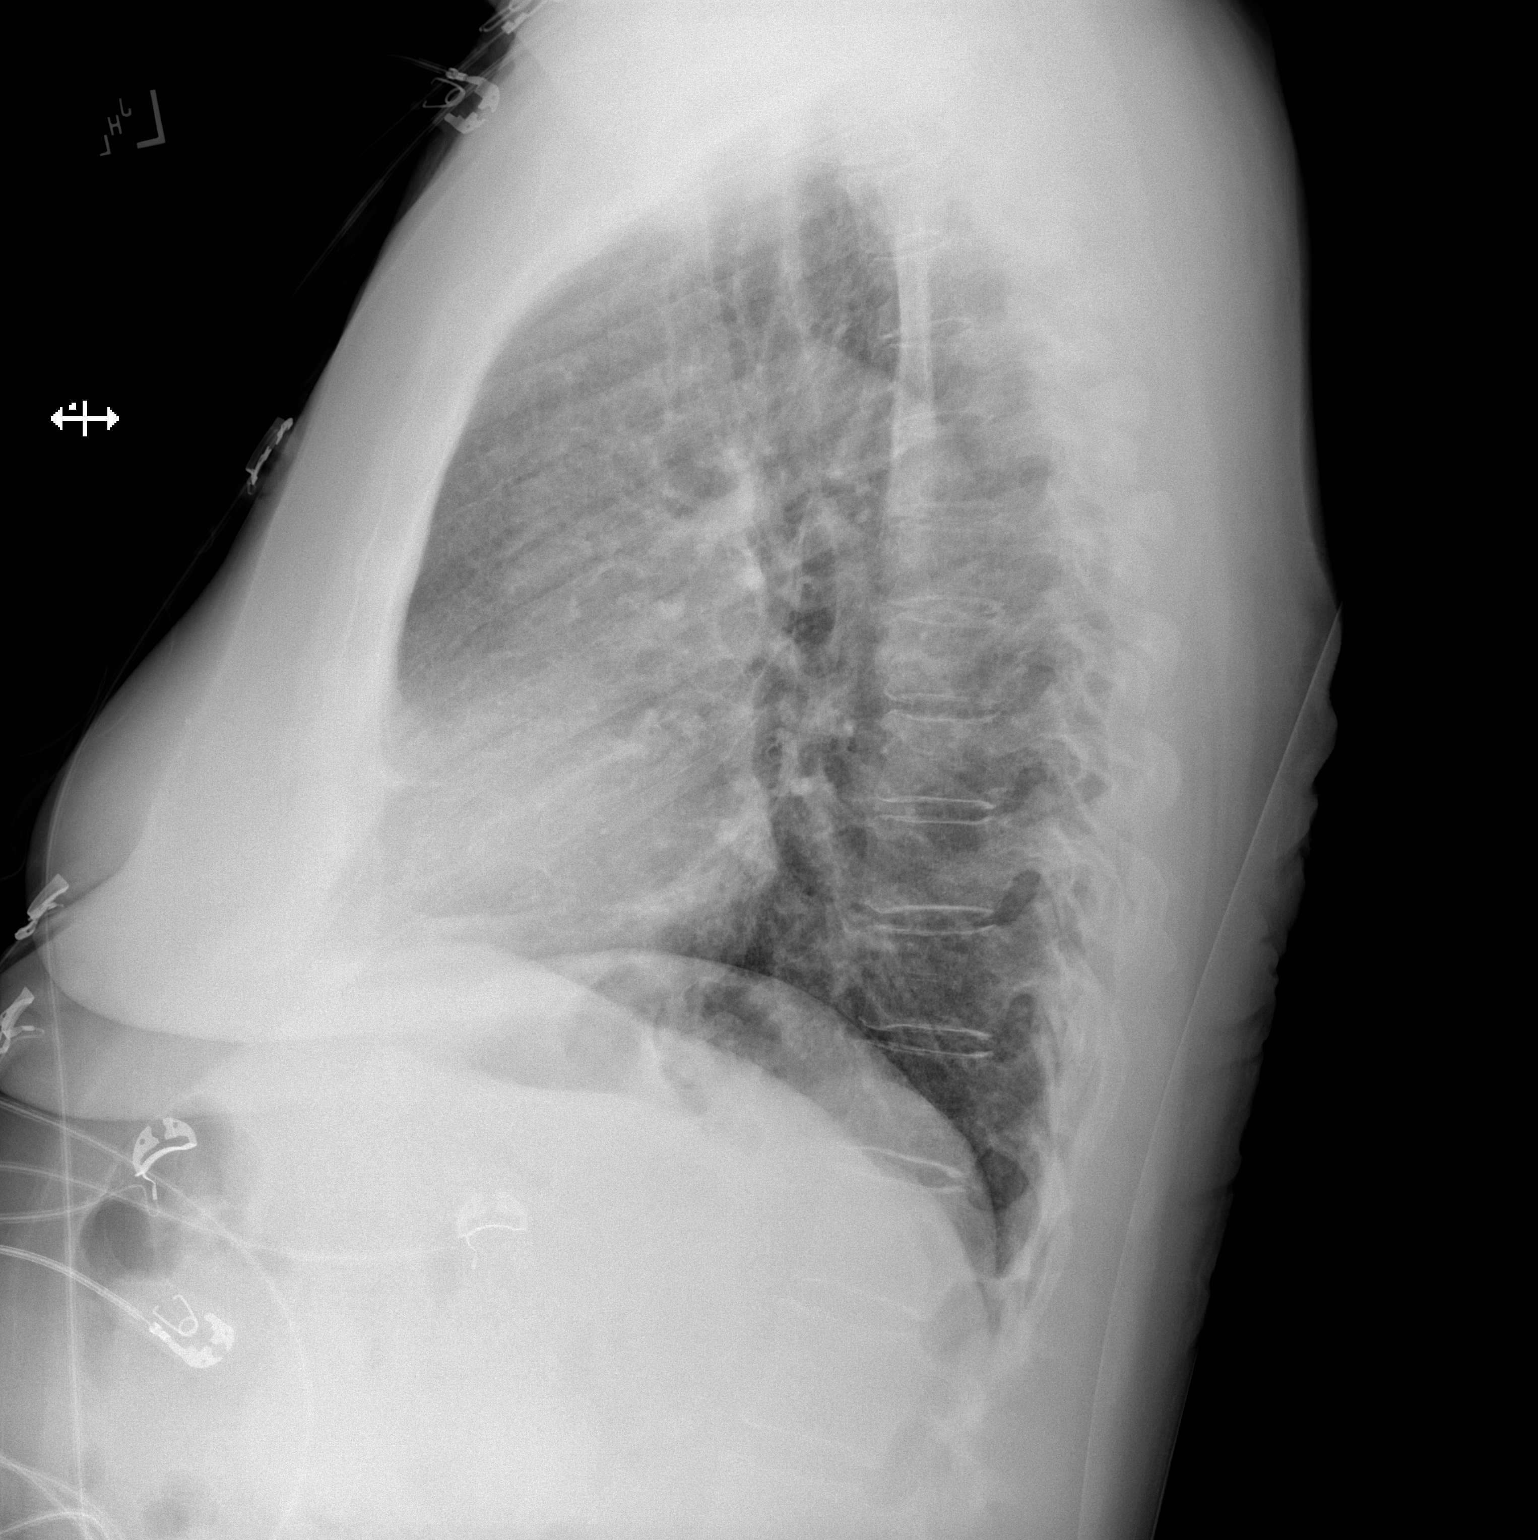

[x chest ap]
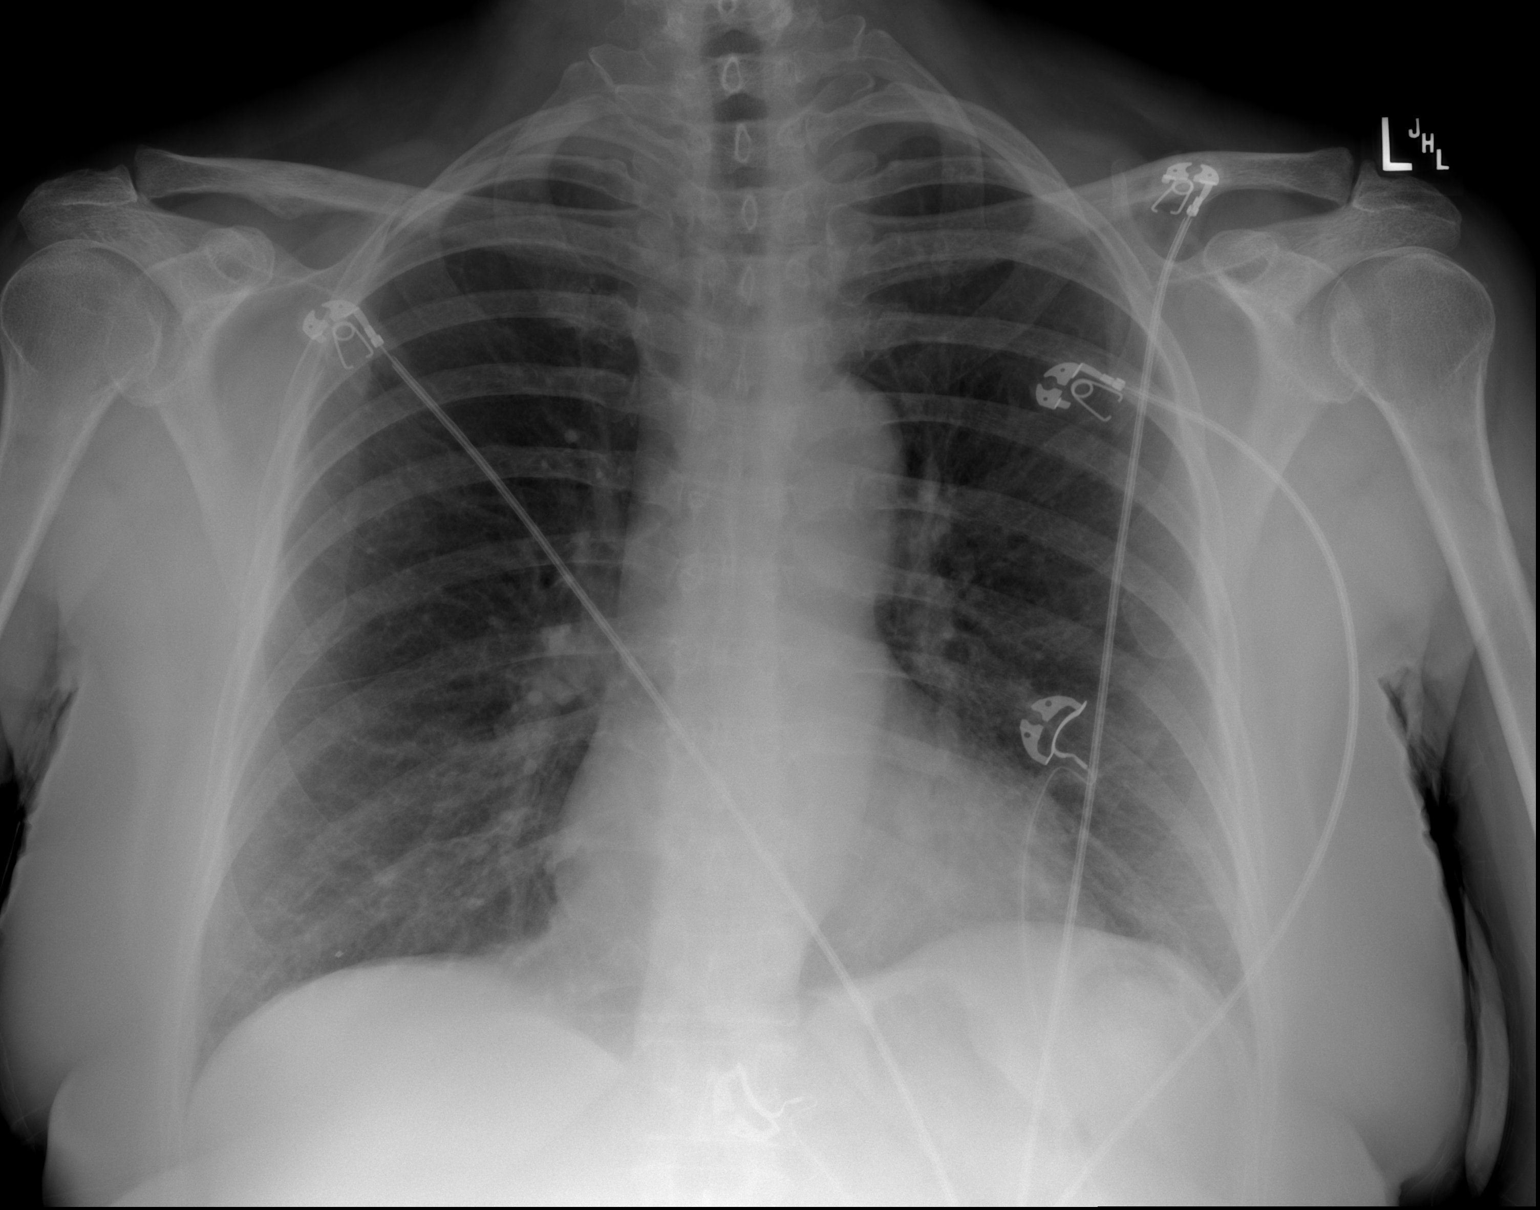

[2 of 2 positions shown; findings below may reference images not displayed]

FINDINGS: Frontal and lateral views of the chest demonstrate an unremarkable
cardiac silhouette. No airspace disease, effusion, or pneumothorax.
No acute bony abnormality.
IMPRESSION: 1. No acute intrathoracic process.

## 2024-01-15 ENCOUNTER — Telehealth: Payer: Self-pay

## 2024-01-15 DIAGNOSIS — Z122 Encounter for screening for malignant neoplasm of respiratory organs: Secondary | ICD-10-CM

## 2024-01-15 DIAGNOSIS — Z87891 Personal history of nicotine dependence: Secondary | ICD-10-CM

## 2024-01-15 NOTE — Telephone Encounter (Signed)
.  Lung Cancer Screening Narrative/Criteria Questionnaire (Cigarette Smokers Only- No Cigars/Pipes/vapes)   Melanie Douglas   SDMV:02/02/2024 at 8:30 am with Melanie Douglas      1956/01/07   LDCT: 02/05/2024 at 11:40 am at [facility]     68 y.o.   Phone: [phone]  Lung Screening Narrative (confirm age 23-77 yrs Medicare / 50-80 yrs Private pay insurance)   Insurance information: BCBS   Referring Provider: Dayton Scrape   This screening involves an initial phone call with a team member from our program. It is called a shared decision making visit. The initial meeting is required by  insurance and Medicare to make sure you understand the program. This appointment takes about 15-20 minutes to complete. You will complete the screening scan at your scheduled date/time.  This scan takes about 5-10 minutes to complete. You can eat and drink normally before and after the scan.  Criteria questions for Lung Cancer Screening:   Are you a current or former smoker? Former Age began smoking: 17   If you are a former smoker, what year did you quit smoking?2016 and one additional year (within 15 yrs)   To calculate your smoking history, I need an accurate estimate of how many packs of cigarettes you smoked per day and for how many years. (Not just the number of PPD you are now smoking)   Years smoking 41 x Packs per day 1.5 = Pack years 61.5 (at least 20 pack yrs)   (Make sure they understand that we need to know how much they have smoked in the past, not just the number of PPD they are smoking now)  Do you have a personal history of cancer?  No    Do you have a family history of cancer? Yes  (cancer type and and relative) Grandfather-  unsure of type, uncle colon  Are you coughing up blood?  No  Have you had unexplained weight loss of 15 lbs or more in the last 6 months? No  It looks like you meet all criteria.  When would be a good time for [place] to schedule you for this screening?   Additional information: N/A

## 2024-02-01 ENCOUNTER — Other Ambulatory Visit: Payer: Self-pay | Admitting: Family

## 2024-02-02 ENCOUNTER — Ambulatory Visit: Admitting: Adult Health

## 2024-02-02 ENCOUNTER — Encounter: Payer: Self-pay | Admitting: Adult Health

## 2024-02-02 DIAGNOSIS — Z87891 Personal history of nicotine dependence: Secondary | ICD-10-CM

## 2024-02-02 NOTE — Progress Notes (Signed)
  Virtual Visit via Telephone Note  I connected with Melanie Douglas , 02/02/24 8:50 AM by a telemedicine application and verified that I am speaking with the correct person using two identifiers.  Location: Patient: home Provider: home   I discussed the limitations of evaluation and management by telemedicine and the availability of in person appointments. The patient expressed understanding and agreed to proceed.   Shared Decision Making Visit Lung Cancer Screening Program [phone])   Eligibility: 68 y.o. Pack Years Smoking History Calculation = 61 pack years  (# packs/per year x # years smoked) Recent History of coughing up blood  no Unexplained weight loss? no ( >Than 15 pounds within the last 6 months ) Prior History Lung / other cancer no (Diagnosis within the last 5 years already requiring surveillance chest CT Scans). Smoking Status Former Smoker Former Smokers: Years since quit: 9 years  Quit Date: 2016  Visit Components: Discussion included one or more decision making aids. YES Discussion included risk/benefits of screening. YES Discussion included potential follow up diagnostic testing for abnormal scans. YES Discussion included meaning and risk of over diagnosis. YES Discussion included meaning and risk of False Positives. YES Discussion included meaning of total radiation exposure. YES  Counseling Included: Importance of adherence to annual lung cancer LDCT screening. YES Impact of comorbidities on ability to participate in the program. YES Ability and willingness to under diagnostic treatment. YES  Smoking Cessation Counseling: Former Smokers:  Discussed the importance of maintaining cigarette abstinence. yes Diagnosis Code: Personal History of Nicotine Dependence. U04.540 Information about tobacco cessation classes and interventions provided to patient. Yes Patient provided with "ticket" for LDCT Scan. yes Written Order for Lung Cancer Screening with LDCT  placed in Epic. Yes (CT Chest Lung Cancer Screening Low Dose W/O CM) [id number]  Z12.2-Screening of respiratory organs Z87.891-Personal history of nicotine dependence   Danford Bad 02/02/24

## 2024-02-02 NOTE — Patient Instructions (Signed)

## 2024-02-05 ENCOUNTER — Ambulatory Visit
Admission: RE | Admit: 2024-02-05 | Discharge: 2024-02-05 | Disposition: A | Discharge status: Home / Self Care | Source: Ambulatory Visit | Attending: Acute Care | Admitting: Acute Care

## 2024-02-05 DIAGNOSIS — Z87891 Personal history of nicotine dependence: Secondary | ICD-10-CM | POA: Diagnosis not present

## 2024-02-05 DIAGNOSIS — Z122 Encounter for screening for malignant neoplasm of respiratory organs: Secondary | ICD-10-CM

## 2024-02-13 ENCOUNTER — Ambulatory Visit (INDEPENDENT_AMBULATORY_CARE_PROVIDER_SITE_OTHER): Admitting: Family

## 2024-02-13 ENCOUNTER — Encounter: Payer: Self-pay | Admitting: Family

## 2024-02-13 VITALS — BP 132/84 | HR 66 | Ht 66.0 in | Wt 188.0 lb

## 2024-02-13 DIAGNOSIS — Z0000 Encounter for general adult medical examination without abnormal findings: Secondary | ICD-10-CM

## 2024-02-13 DIAGNOSIS — I10 Essential (primary) hypertension: Secondary | ICD-10-CM | POA: Diagnosis not present

## 2024-02-13 DIAGNOSIS — R7309 Other abnormal glucose: Secondary | ICD-10-CM | POA: Diagnosis not present

## 2024-02-13 DIAGNOSIS — Z1231 Encounter for screening mammogram for malignant neoplasm of breast: Secondary | ICD-10-CM

## 2024-02-13 DIAGNOSIS — Z23 Encounter for immunization: Secondary | ICD-10-CM | POA: Diagnosis not present

## 2024-02-13 DIAGNOSIS — Z13220 Encounter for screening for lipoid disorders: Secondary | ICD-10-CM | POA: Diagnosis not present

## 2024-02-13 LAB — CBC WITH DIFFERENTIAL/PLATELET
Basophils Absolute: 0.1 10*3/uL (ref 0.0–0.1)
Basophils Relative: 1 % (ref 0.0–3.0)
Eosinophils Absolute: 0.2 10*3/uL (ref 0.0–0.7)
Eosinophils Relative: 3.3 % (ref 0.0–5.0)
HCT: 40.1 % (ref 36.0–46.0)
Hemoglobin: 13.5 g/dL (ref 12.0–15.0)
Lymphocytes Relative: 25.2 % (ref 12.0–46.0)
Lymphs Abs: 1.6 10*3/uL (ref 0.7–4.0)
MCHC: 33.5 g/dL (ref 30.0–36.0)
MCV: 93.5 fl (ref 78.0–100.0)
Monocytes Absolute: 0.5 10*3/uL (ref 0.1–1.0)
Monocytes Relative: 8.7 % (ref 3.0–12.0)
Neutro Abs: 3.8 10*3/uL (ref 1.4–7.7)
Neutrophils Relative %: 61.8 % (ref 43.0–77.0)
Platelets: 236 10*3/uL (ref 150.0–400.0)
RBC: 4.29 Mil/uL (ref 3.87–5.11)
RDW: 12.9 % (ref 11.5–15.5)
WBC: 6.2 10*3/uL (ref 4.0–10.5)

## 2024-02-13 LAB — COMPREHENSIVE METABOLIC PANEL WITH GFR
ALT: 20 U/L (ref 0–35)
AST: 17 U/L (ref 0–37)
Albumin: 4.3 g/dL (ref 3.5–5.2)
Alkaline Phosphatase: 58 U/L (ref 39–117)
BUN: 16 mg/dL (ref 6–23)
CO2: 23 meq/L (ref 19–32)
Calcium: 9.3 mg/dL (ref 8.4–10.5)
Chloride: 106 meq/L (ref 96–112)
Creatinine, Ser: 0.82 mg/dL (ref 0.40–1.20)
GFR: 74.33 mL/min (ref >60.00)
Glucose, Bld: 112 mg/dL — ABNORMAL HIGH (ref 70–99)
Potassium: 4.3 meq/L (ref 3.5–5.1)
Sodium: 139 meq/L (ref 135–145)
Total Bilirubin: 0.4 mg/dL (ref 0.2–1.2)
Total Protein: 6.7 g/dL (ref 6.0–8.3)

## 2024-02-13 LAB — LIPID PANEL
Cholesterol: 146 mg/dL (ref 0–200)
HDL: 48.7 mg/dL (ref >39.00)
LDL Cholesterol: 41 mg/dL (ref 0–99)
NonHDL: 96.9
Total CHOL/HDL Ratio: 3
Triglycerides: 281 mg/dL — ABNORMAL HIGH (ref 0.0–149.0)
VLDL: 56.2 mg/dL — ABNORMAL HIGH (ref 0.0–40.0)

## 2024-02-13 LAB — HEMOGLOBIN A1C: Hgb A1c MFr Bld: 6.2 % (ref 4.6–6.5)

## 2024-02-13 MED ORDER — NEBIVOLOL HCL 20 MG PO TABS: 20.0000 mg | ORAL_TABLET | Freq: Every day | ORAL | 3 refills | Status: AC

## 2024-02-13 NOTE — Addendum Note (Signed)
 Addended by: Judieth Keens on: 02/13/2024 09:13 AM   Modules accepted: Orders

## 2024-02-13 NOTE — Progress Notes (Signed)
 Melanie Douglas is a 68 y.o. female with the following history as recorded in EpicCare:  Patient Active Problem List   Diagnosis Date Noted   Essential hypertension 09/18/2015   Left arm pain 08/31/2015   Lung nodule 08/31/2015    Current Outpatient Medications  Medication Sig Dispense Refill   cholecalciferol (VITAMIN D3) 25 MCG (1000 UNIT) tablet Take 1,000 Units by mouth daily.     ibuprofen (ADVIL) 800 MG tablet Take 1 tablet (800 mg total) by mouth every 8 (eight) hours as needed. 90 tablet 0   Omega-3 Fatty Acids (FISH OIL) 1000 MG CAPS Take by mouth.     rosuvastatin (CRESTOR) 10 MG tablet TAKE 1 TABLET BY MOUTH EVERY DAY 90 tablet 3   Turmeric 500 MG CAPS Take by mouth.     vitamin E 180 MG (400 UNITS) capsule Take 400 Units by mouth daily.     Nebivolol HCl (BYSTOLIC) 20 MG TABS Take 1 tablet (20 mg total) by mouth daily. 90 tablet 3   No current facility-administered medications for this visit.    Allergies: Patient has no known allergies.  Past Medical History:  Diagnosis Date   Arthritis    hands, left elbow   Chicken pox    Hyperlipidemia    Hypertension    Papilloma    left breast   UTI (lower urinary tract infection)     Past Surgical History:  Procedure Laterality Date   BREAST BIOPSY Left 09/12/2022   [place] LT BREAST BX W LOC DEV 1ST LESION IMG BX SPEC US GUIDE 09/12/2022 [facility]   BREAST BIOPSY Left 09/12/2022   [place] LT BREAST BX W LOC DEV EA ADD LESION IMG BX SPEC US GUIDE 09/12/2022 [facility]   BREAST BIOPSY  11/22/2022   MM LT RADIOACTIVE SEED EA ADD LESION LOC MAMMO GUIDE 11/22/2022 [facility]   BREAST BIOPSY  11/22/2022   MM LT RADIOACTIVE SEED LOC MAMMO GUIDE 11/22/2022 [facility]   COLONOSCOPY  07/16/2018   Dr.Gupta   RADIOACTIVE SEED GUIDED EXCISIONAL BREAST BIOPSY Left 11/23/2022   Procedure: LEFT BRACKETED RADIOACTIVE SEED GUIDED EXCISIONAL BREAST BIOPSY;  Surgeon: Emelia Loron, MD;  Location: [facility];  Service: General;  Laterality: Left;    Family History  Problem Relation Age of Onset   Hyperlipidemia Mother    Hypertension Mother    Healthy Father    Healthy Maternal Grandmother    Prostate cancer Maternal Grandfather    Healthy Paternal Grandmother    Healthy Paternal Grandfather    Colon cancer Maternal Uncle    Colon polyps Neg Hx    Esophageal cancer Neg Hx    Stomach cancer Neg Hx    Rectal cancer Neg Hx    Breast cancer Neg Hx     Social History   Tobacco Use   Smoking status: Former    Current packs/day: 0.00    Average packs/day: 1 pack/day for 40.0 years (40.0 ttl pk-yrs)    Types: Cigarettes    Start date: 12/02/1974    Quit date: 12/02/2014    Years since quitting: 9.2   Smokeless tobacco: Never  Substance Use Topics   Alcohol use: Yes    Alcohol/week: 14.0 standard drinks of alcohol    Types: 14 Glasses of wine per week    Comment: daily-wine 2 glasses    Subjective:   Presents for yearly CPE; no acute concerns today; does need to get order for mammogram updated today; agreeable to  getting Pneumovax today;  Does see dermatology regularly;   Review of Systems  Constitutional: Negative.   HENT: Negative.    Eyes: Negative.   Respiratory: Negative.    Cardiovascular: Negative.   Gastrointestinal: Negative.   Genitourinary: Negative.   Musculoskeletal: Negative.   Skin: Negative.   Neurological: Negative.   Endo/Heme/Allergies: Negative.   Psychiatric/Behavioral: Negative.      Objective:  Vitals:   02/13/24 0827  BP: 132/84  Pulse: 66  SpO2: 97%  Weight: 188 lb (85.3 kg)  Height: 5\' 6"  (1.676 m)    General: Well developed, well nourished, in no acute distress  Skin : Warm and dry.  Head: Normocephalic and atraumatic  Eyes: Sclera and conjunctiva clear; pupils round and reactive to light; extraocular movements intact  Ears: External normal; canals clear; tympanic membranes normal  Oropharynx: Pink, supple. No suspicious lesions   Neck: Supple without thyromegaly, adenopathy  Lungs: Respirations unlabored; clear to auscultation bilaterally without wheeze, rales, rhonchi  CVS exam: normal rate and regular rhythm.  Abdomen: Soft; nontender; nondistended; normoactive bowel sounds; no masses or hepatosplenomegaly  Musculoskeletal: No deformities; no active joint inflammation  Extremities: No edema, cyanosis, clubbing  Vessels: Symmetric bilaterally  Neurologic: Alert and oriented; speech intact; face symmetrical; moves all extremities well; CNII-XII intact without focal deficit   Assessment:  1. PE (physical exam), annual   2. Essential hypertension   3. Lipid screening   4. Elevated glucose     Plan:  Age appropriate preventive healthcare needs addressed; encouraged regular eye doctor and dental exams; encouraged regular exercise; will update labs and refills as needed today; Prevnar 20 updated today; follow-up in 1 year, sooner prn.    No follow-ups on file.  Orders Placed This Encounter  Procedures   CBC with Differential/Platelet   Comp Met (CMET)   Lipid panel   Hemoglobin A1c    Requested Prescriptions   Signed Prescriptions Disp Refills   Nebivolol HCl (BYSTOLIC) 20 MG TABS 90 tablet 3    Sig: Take 1 tablet (20 mg total) by mouth daily.

## 2024-02-15 ENCOUNTER — Encounter: Payer: Self-pay | Admitting: Family

## 2024-02-28 ENCOUNTER — Ambulatory Visit
Admission: RE | Admit: 2024-02-28 | Discharge: 2024-02-28 | Disposition: A | Discharge status: Home / Self Care | Source: Ambulatory Visit | Attending: Family | Admitting: Family

## 2024-02-28 DIAGNOSIS — Z1231 Encounter for screening mammogram for malignant neoplasm of breast: Secondary | ICD-10-CM | POA: Diagnosis not present

## 2024-03-08 ENCOUNTER — Other Ambulatory Visit: Payer: Self-pay

## 2024-03-08 DIAGNOSIS — Z122 Encounter for screening for malignant neoplasm of respiratory organs: Secondary | ICD-10-CM

## 2024-03-08 DIAGNOSIS — Z87891 Personal history of nicotine dependence: Secondary | ICD-10-CM

## 2024-05-07 DIAGNOSIS — L821 Other seborrheic keratosis: Secondary | ICD-10-CM | POA: Diagnosis not present

## 2024-05-07 DIAGNOSIS — L820 Inflamed seborrheic keratosis: Secondary | ICD-10-CM | POA: Diagnosis not present

## 2024-06-24 DIAGNOSIS — L820 Inflamed seborrheic keratosis: Secondary | ICD-10-CM | POA: Diagnosis not present
